# Patient Record
Sex: Male | Born: 1935 | Race: White | Hispanic: No | Marital: Married | Smoking: Former smoker
Health system: Southern US, Community
[De-identification: ages and names within clinical notes are randomized; demographics above are authoritative.]

## PROBLEM LIST (undated history)

## (undated) DIAGNOSIS — I2510 Atherosclerotic heart disease of native coronary artery without angina pectoris: Secondary | ICD-10-CM

## (undated) DIAGNOSIS — N189 Chronic kidney disease, unspecified: Secondary | ICD-10-CM

## (undated) DIAGNOSIS — J449 Chronic obstructive pulmonary disease, unspecified: Secondary | ICD-10-CM

## (undated) DIAGNOSIS — E785 Hyperlipidemia, unspecified: Secondary | ICD-10-CM

## (undated) DIAGNOSIS — H409 Unspecified glaucoma: Secondary | ICD-10-CM

## (undated) DIAGNOSIS — R0602 Shortness of breath: Secondary | ICD-10-CM

## (undated) DIAGNOSIS — I10 Essential (primary) hypertension: Secondary | ICD-10-CM

## (undated) HISTORY — DX: Hyperlipidemia, unspecified: E78.5

## (undated) HISTORY — DX: Chronic obstructive pulmonary disease, unspecified: J44.9

## (undated) HISTORY — PX: CORONARY ARTERY BYPASS GRAFT: SHX141

## (undated) HISTORY — DX: Atherosclerotic heart disease of native coronary artery without angina pectoris: I25.10

## (undated) HISTORY — DX: Chronic kidney disease, unspecified: N18.9

## (undated) HISTORY — DX: Shortness of breath: R06.02

## (undated) HISTORY — DX: Unspecified glaucoma: H40.9

## (undated) HISTORY — DX: Essential (primary) hypertension: I10

---

## 2006-11-15 ENCOUNTER — Ambulatory Visit: Payer: Self-pay | Admitting: Cardiothoracic Surgery

## 2006-12-27 ENCOUNTER — Ambulatory Visit: Payer: Self-pay | Admitting: Thoracic Surgery (Cardiothoracic Vascular Surgery)

## 2014-04-25 DIAGNOSIS — M353 Polymyalgia rheumatica: Secondary | ICD-10-CM | POA: Diagnosis not present

## 2014-04-25 DIAGNOSIS — Z79899 Other long term (current) drug therapy: Secondary | ICD-10-CM | POA: Diagnosis not present

## 2014-04-29 DIAGNOSIS — I2510 Atherosclerotic heart disease of native coronary artery without angina pectoris: Secondary | ICD-10-CM | POA: Diagnosis not present

## 2014-05-29 DIAGNOSIS — I2510 Atherosclerotic heart disease of native coronary artery without angina pectoris: Secondary | ICD-10-CM | POA: Diagnosis not present

## 2014-05-29 DIAGNOSIS — E119 Type 2 diabetes mellitus without complications: Secondary | ICD-10-CM | POA: Diagnosis not present

## 2014-05-29 DIAGNOSIS — I10 Essential (primary) hypertension: Secondary | ICD-10-CM | POA: Diagnosis not present

## 2014-05-29 DIAGNOSIS — E785 Hyperlipidemia, unspecified: Secondary | ICD-10-CM | POA: Diagnosis not present

## 2014-06-19 DIAGNOSIS — H4011X3 Primary open-angle glaucoma, severe stage: Secondary | ICD-10-CM | POA: Diagnosis not present

## 2014-06-19 DIAGNOSIS — H2513 Age-related nuclear cataract, bilateral: Secondary | ICD-10-CM | POA: Diagnosis not present

## 2014-07-21 DIAGNOSIS — H4011X3 Primary open-angle glaucoma, severe stage: Secondary | ICD-10-CM | POA: Diagnosis not present

## 2014-07-25 DIAGNOSIS — M353 Polymyalgia rheumatica: Secondary | ICD-10-CM | POA: Diagnosis not present

## 2014-07-25 DIAGNOSIS — Z79899 Other long term (current) drug therapy: Secondary | ICD-10-CM | POA: Diagnosis not present

## 2014-09-01 DIAGNOSIS — E559 Vitamin D deficiency, unspecified: Secondary | ICD-10-CM | POA: Diagnosis not present

## 2014-09-01 DIAGNOSIS — E785 Hyperlipidemia, unspecified: Secondary | ICD-10-CM | POA: Diagnosis not present

## 2014-09-01 DIAGNOSIS — J449 Chronic obstructive pulmonary disease, unspecified: Secondary | ICD-10-CM | POA: Diagnosis not present

## 2014-09-01 DIAGNOSIS — I10 Essential (primary) hypertension: Secondary | ICD-10-CM | POA: Diagnosis not present

## 2014-09-01 DIAGNOSIS — N182 Chronic kidney disease, stage 2 (mild): Secondary | ICD-10-CM | POA: Diagnosis not present

## 2014-09-01 DIAGNOSIS — I119 Hypertensive heart disease without heart failure: Secondary | ICD-10-CM | POA: Diagnosis not present

## 2014-09-01 DIAGNOSIS — Z79899 Other long term (current) drug therapy: Secondary | ICD-10-CM | POA: Diagnosis not present

## 2014-09-01 DIAGNOSIS — E1169 Type 2 diabetes mellitus with other specified complication: Secondary | ICD-10-CM | POA: Diagnosis not present

## 2014-09-01 DIAGNOSIS — I1310 Hypertensive heart and chronic kidney disease without heart failure, with stage 1 through stage 4 chronic kidney disease, or unspecified chronic kidney disease: Secondary | ICD-10-CM | POA: Diagnosis not present

## 2014-10-01 DIAGNOSIS — N182 Chronic kidney disease, stage 2 (mild): Secondary | ICD-10-CM | POA: Diagnosis not present

## 2014-10-01 DIAGNOSIS — I1310 Hypertensive heart and chronic kidney disease without heart failure, with stage 1 through stage 4 chronic kidney disease, or unspecified chronic kidney disease: Secondary | ICD-10-CM | POA: Diagnosis not present

## 2014-10-01 DIAGNOSIS — I119 Hypertensive heart disease without heart failure: Secondary | ICD-10-CM | POA: Diagnosis not present

## 2014-10-07 DIAGNOSIS — E785 Hyperlipidemia, unspecified: Secondary | ICD-10-CM

## 2014-10-07 DIAGNOSIS — I10 Essential (primary) hypertension: Secondary | ICD-10-CM | POA: Insufficient documentation

## 2014-10-07 DIAGNOSIS — I2510 Atherosclerotic heart disease of native coronary artery without angina pectoris: Secondary | ICD-10-CM | POA: Insufficient documentation

## 2014-10-07 DIAGNOSIS — E118 Type 2 diabetes mellitus with unspecified complications: Secondary | ICD-10-CM | POA: Diagnosis not present

## 2014-10-07 HISTORY — DX: Essential (primary) hypertension: I10

## 2014-10-07 HISTORY — DX: Atherosclerotic heart disease of native coronary artery without angina pectoris: I25.10

## 2014-10-07 HISTORY — DX: Hyperlipidemia, unspecified: E78.5

## 2014-10-24 DIAGNOSIS — M353 Polymyalgia rheumatica: Secondary | ICD-10-CM | POA: Diagnosis not present

## 2014-10-24 DIAGNOSIS — M2550 Pain in unspecified joint: Secondary | ICD-10-CM | POA: Diagnosis not present

## 2014-10-24 DIAGNOSIS — Z79899 Other long term (current) drug therapy: Secondary | ICD-10-CM | POA: Diagnosis not present

## 2014-11-17 DIAGNOSIS — H4011X3 Primary open-angle glaucoma, severe stage: Secondary | ICD-10-CM | POA: Diagnosis not present

## 2014-12-03 DIAGNOSIS — I1310 Hypertensive heart and chronic kidney disease without heart failure, with stage 1 through stage 4 chronic kidney disease, or unspecified chronic kidney disease: Secondary | ICD-10-CM | POA: Diagnosis not present

## 2014-12-03 DIAGNOSIS — N182 Chronic kidney disease, stage 2 (mild): Secondary | ICD-10-CM | POA: Diagnosis not present

## 2014-12-17 DIAGNOSIS — Z23 Encounter for immunization: Secondary | ICD-10-CM | POA: Diagnosis not present

## 2015-02-06 DIAGNOSIS — Z23 Encounter for immunization: Secondary | ICD-10-CM | POA: Diagnosis not present

## 2015-02-06 DIAGNOSIS — H04129 Dry eye syndrome of unspecified lacrimal gland: Secondary | ICD-10-CM | POA: Diagnosis not present

## 2015-02-06 DIAGNOSIS — M2550 Pain in unspecified joint: Secondary | ICD-10-CM | POA: Diagnosis not present

## 2015-02-06 DIAGNOSIS — M353 Polymyalgia rheumatica: Secondary | ICD-10-CM | POA: Diagnosis not present

## 2015-02-11 DIAGNOSIS — J0100 Acute maxillary sinusitis, unspecified: Secondary | ICD-10-CM | POA: Diagnosis not present

## 2015-02-11 DIAGNOSIS — J209 Acute bronchitis, unspecified: Secondary | ICD-10-CM | POA: Diagnosis not present

## 2015-02-16 DIAGNOSIS — H401193 Primary open-angle glaucoma, unspecified eye, severe stage: Secondary | ICD-10-CM | POA: Diagnosis not present

## 2015-02-23 DIAGNOSIS — M353 Polymyalgia rheumatica: Secondary | ICD-10-CM | POA: Diagnosis not present

## 2015-02-23 DIAGNOSIS — E785 Hyperlipidemia, unspecified: Secondary | ICD-10-CM | POA: Diagnosis not present

## 2015-02-23 DIAGNOSIS — N182 Chronic kidney disease, stage 2 (mild): Secondary | ICD-10-CM | POA: Diagnosis not present

## 2015-02-23 DIAGNOSIS — I119 Hypertensive heart disease without heart failure: Secondary | ICD-10-CM | POA: Diagnosis not present

## 2015-02-23 DIAGNOSIS — E1169 Type 2 diabetes mellitus with other specified complication: Secondary | ICD-10-CM | POA: Diagnosis not present

## 2015-02-23 DIAGNOSIS — Z79899 Other long term (current) drug therapy: Secondary | ICD-10-CM | POA: Diagnosis not present

## 2015-02-23 DIAGNOSIS — Z136 Encounter for screening for cardiovascular disorders: Secondary | ICD-10-CM | POA: Diagnosis not present

## 2015-02-23 DIAGNOSIS — Z1211 Encounter for screening for malignant neoplasm of colon: Secondary | ICD-10-CM | POA: Diagnosis not present

## 2015-02-23 DIAGNOSIS — I10 Essential (primary) hypertension: Secondary | ICD-10-CM | POA: Diagnosis not present

## 2015-02-23 DIAGNOSIS — Z125 Encounter for screening for malignant neoplasm of prostate: Secondary | ICD-10-CM | POA: Diagnosis not present

## 2015-02-23 DIAGNOSIS — I1310 Hypertensive heart and chronic kidney disease without heart failure, with stage 1 through stage 4 chronic kidney disease, or unspecified chronic kidney disease: Secondary | ICD-10-CM | POA: Diagnosis not present

## 2015-02-23 DIAGNOSIS — Z0000 Encounter for general adult medical examination without abnormal findings: Secondary | ICD-10-CM | POA: Diagnosis not present

## 2015-03-09 DIAGNOSIS — H401193 Primary open-angle glaucoma, unspecified eye, severe stage: Secondary | ICD-10-CM | POA: Diagnosis not present

## 2015-03-30 DIAGNOSIS — H401193 Primary open-angle glaucoma, unspecified eye, severe stage: Secondary | ICD-10-CM | POA: Diagnosis not present

## 2015-04-13 DIAGNOSIS — E0811 Diabetes mellitus due to underlying condition with ketoacidosis with coma: Secondary | ICD-10-CM | POA: Diagnosis not present

## 2015-04-13 DIAGNOSIS — I10 Essential (primary) hypertension: Secondary | ICD-10-CM | POA: Diagnosis not present

## 2015-04-13 DIAGNOSIS — I2510 Atherosclerotic heart disease of native coronary artery without angina pectoris: Secondary | ICD-10-CM | POA: Diagnosis not present

## 2015-05-04 DIAGNOSIS — H401193 Primary open-angle glaucoma, unspecified eye, severe stage: Secondary | ICD-10-CM | POA: Diagnosis not present

## 2015-05-09 DIAGNOSIS — H04129 Dry eye syndrome of unspecified lacrimal gland: Secondary | ICD-10-CM

## 2015-05-09 DIAGNOSIS — E559 Vitamin D deficiency, unspecified: Secondary | ICD-10-CM

## 2015-05-09 DIAGNOSIS — J90 Pleural effusion, not elsewhere classified: Secondary | ICD-10-CM | POA: Insufficient documentation

## 2015-05-09 DIAGNOSIS — H349 Unspecified retinal vascular occlusion: Secondary | ICD-10-CM

## 2015-05-09 DIAGNOSIS — H409 Unspecified glaucoma: Secondary | ICD-10-CM

## 2015-05-09 DIAGNOSIS — J309 Allergic rhinitis, unspecified: Secondary | ICD-10-CM

## 2015-05-09 DIAGNOSIS — M2550 Pain in unspecified joint: Secondary | ICD-10-CM | POA: Insufficient documentation

## 2015-05-09 DIAGNOSIS — Z79899 Other long term (current) drug therapy: Secondary | ICD-10-CM

## 2015-05-09 DIAGNOSIS — G894 Chronic pain syndrome: Secondary | ICD-10-CM

## 2015-05-09 DIAGNOSIS — J449 Chronic obstructive pulmonary disease, unspecified: Secondary | ICD-10-CM | POA: Insufficient documentation

## 2015-05-09 DIAGNOSIS — E119 Type 2 diabetes mellitus without complications: Secondary | ICD-10-CM | POA: Insufficient documentation

## 2015-05-09 HISTORY — DX: Dry eye syndrome of unspecified lacrimal gland: H04.129

## 2015-05-09 HISTORY — DX: Pain in unspecified joint: M25.50

## 2015-05-09 HISTORY — DX: Unspecified glaucoma: H40.9

## 2015-05-09 HISTORY — DX: Chronic pain syndrome: G89.4

## 2015-05-09 HISTORY — DX: Vitamin D deficiency, unspecified: E55.9

## 2015-05-09 HISTORY — DX: Unspecified retinal vascular occlusion: H34.9

## 2015-05-09 HISTORY — DX: Other long term (current) drug therapy: Z79.899

## 2015-05-09 HISTORY — DX: Allergic rhinitis, unspecified: J30.9

## 2015-05-09 HISTORY — DX: Pleural effusion, not elsewhere classified: J90

## 2015-05-11 DIAGNOSIS — Z79899 Other long term (current) drug therapy: Secondary | ICD-10-CM | POA: Diagnosis not present

## 2015-05-11 DIAGNOSIS — M2550 Pain in unspecified joint: Secondary | ICD-10-CM | POA: Diagnosis not present

## 2015-05-11 DIAGNOSIS — M353 Polymyalgia rheumatica: Secondary | ICD-10-CM | POA: Diagnosis not present

## 2015-06-08 DIAGNOSIS — H2513 Age-related nuclear cataract, bilateral: Secondary | ICD-10-CM | POA: Diagnosis not present

## 2015-06-08 DIAGNOSIS — H0289 Other specified disorders of eyelid: Secondary | ICD-10-CM | POA: Diagnosis not present

## 2015-06-08 DIAGNOSIS — H25013 Cortical age-related cataract, bilateral: Secondary | ICD-10-CM | POA: Diagnosis not present

## 2015-06-08 DIAGNOSIS — H401130 Primary open-angle glaucoma, bilateral, stage unspecified: Secondary | ICD-10-CM | POA: Diagnosis not present

## 2015-06-15 DIAGNOSIS — H401110 Primary open-angle glaucoma, right eye, stage unspecified: Secondary | ICD-10-CM | POA: Diagnosis not present

## 2015-06-29 DIAGNOSIS — H401132 Primary open-angle glaucoma, bilateral, moderate stage: Secondary | ICD-10-CM | POA: Diagnosis not present

## 2015-07-13 DIAGNOSIS — H401120 Primary open-angle glaucoma, left eye, stage unspecified: Secondary | ICD-10-CM | POA: Diagnosis not present

## 2015-07-27 DIAGNOSIS — H401193 Primary open-angle glaucoma, unspecified eye, severe stage: Secondary | ICD-10-CM | POA: Diagnosis not present

## 2015-08-17 DIAGNOSIS — Z79899 Other long term (current) drug therapy: Secondary | ICD-10-CM | POA: Diagnosis not present

## 2015-08-17 DIAGNOSIS — M353 Polymyalgia rheumatica: Secondary | ICD-10-CM | POA: Diagnosis not present

## 2015-08-17 DIAGNOSIS — M2550 Pain in unspecified joint: Secondary | ICD-10-CM | POA: Diagnosis not present

## 2015-08-17 DIAGNOSIS — H401132 Primary open-angle glaucoma, bilateral, moderate stage: Secondary | ICD-10-CM | POA: Diagnosis not present

## 2015-09-01 DIAGNOSIS — E1169 Type 2 diabetes mellitus with other specified complication: Secondary | ICD-10-CM | POA: Diagnosis not present

## 2015-09-01 DIAGNOSIS — M353 Polymyalgia rheumatica: Secondary | ICD-10-CM | POA: Diagnosis not present

## 2015-09-01 DIAGNOSIS — Z79899 Other long term (current) drug therapy: Secondary | ICD-10-CM | POA: Diagnosis not present

## 2015-09-01 DIAGNOSIS — E785 Hyperlipidemia, unspecified: Secondary | ICD-10-CM | POA: Diagnosis not present

## 2015-09-01 DIAGNOSIS — Z87898 Personal history of other specified conditions: Secondary | ICD-10-CM | POA: Diagnosis not present

## 2015-09-01 DIAGNOSIS — I119 Hypertensive heart disease without heart failure: Secondary | ICD-10-CM | POA: Diagnosis not present

## 2015-09-01 DIAGNOSIS — I2510 Atherosclerotic heart disease of native coronary artery without angina pectoris: Secondary | ICD-10-CM | POA: Diagnosis not present

## 2015-10-09 DIAGNOSIS — I10 Essential (primary) hypertension: Secondary | ICD-10-CM | POA: Diagnosis not present

## 2015-10-09 DIAGNOSIS — E0800 Diabetes mellitus due to underlying condition with hyperosmolarity without nonketotic hyperglycemic-hyperosmolar coma (NKHHC): Secondary | ICD-10-CM | POA: Diagnosis not present

## 2015-10-09 DIAGNOSIS — I2510 Atherosclerotic heart disease of native coronary artery without angina pectoris: Secondary | ICD-10-CM | POA: Diagnosis not present

## 2015-10-09 DIAGNOSIS — E785 Hyperlipidemia, unspecified: Secondary | ICD-10-CM | POA: Diagnosis not present

## 2015-11-23 DIAGNOSIS — M2550 Pain in unspecified joint: Secondary | ICD-10-CM | POA: Diagnosis not present

## 2015-11-23 DIAGNOSIS — M353 Polymyalgia rheumatica: Secondary | ICD-10-CM | POA: Diagnosis not present

## 2015-11-23 DIAGNOSIS — Z79899 Other long term (current) drug therapy: Secondary | ICD-10-CM | POA: Diagnosis not present

## 2015-12-07 DIAGNOSIS — Z23 Encounter for immunization: Secondary | ICD-10-CM | POA: Diagnosis not present

## 2015-12-17 DIAGNOSIS — H401193 Primary open-angle glaucoma, unspecified eye, severe stage: Secondary | ICD-10-CM | POA: Diagnosis not present

## 2016-01-18 DIAGNOSIS — H401193 Primary open-angle glaucoma, unspecified eye, severe stage: Secondary | ICD-10-CM | POA: Diagnosis not present

## 2016-01-18 DIAGNOSIS — H2513 Age-related nuclear cataract, bilateral: Secondary | ICD-10-CM | POA: Diagnosis not present

## 2016-03-10 DIAGNOSIS — E1169 Type 2 diabetes mellitus with other specified complication: Secondary | ICD-10-CM | POA: Diagnosis not present

## 2016-03-10 DIAGNOSIS — I10 Essential (primary) hypertension: Secondary | ICD-10-CM | POA: Diagnosis not present

## 2016-03-10 DIAGNOSIS — N183 Chronic kidney disease, stage 3 (moderate): Secondary | ICD-10-CM | POA: Diagnosis not present

## 2016-03-10 DIAGNOSIS — D751 Secondary polycythemia: Secondary | ICD-10-CM | POA: Diagnosis not present

## 2016-03-10 DIAGNOSIS — E785 Hyperlipidemia, unspecified: Secondary | ICD-10-CM | POA: Diagnosis not present

## 2016-03-10 DIAGNOSIS — E559 Vitamin D deficiency, unspecified: Secondary | ICD-10-CM | POA: Diagnosis not present

## 2016-03-10 DIAGNOSIS — Z0000 Encounter for general adult medical examination without abnormal findings: Secondary | ICD-10-CM | POA: Diagnosis not present

## 2016-03-10 DIAGNOSIS — R5383 Other fatigue: Secondary | ICD-10-CM | POA: Diagnosis not present

## 2016-03-10 DIAGNOSIS — Z125 Encounter for screening for malignant neoplasm of prostate: Secondary | ICD-10-CM | POA: Diagnosis not present

## 2016-03-10 DIAGNOSIS — Z79899 Other long term (current) drug therapy: Secondary | ICD-10-CM | POA: Diagnosis not present

## 2016-03-30 DIAGNOSIS — M353 Polymyalgia rheumatica: Secondary | ICD-10-CM | POA: Diagnosis not present

## 2016-03-30 DIAGNOSIS — Z79899 Other long term (current) drug therapy: Secondary | ICD-10-CM | POA: Diagnosis not present

## 2016-03-30 DIAGNOSIS — I1310 Hypertensive heart and chronic kidney disease without heart failure, with stage 1 through stage 4 chronic kidney disease, or unspecified chronic kidney disease: Secondary | ICD-10-CM | POA: Diagnosis not present

## 2016-03-30 DIAGNOSIS — J449 Chronic obstructive pulmonary disease, unspecified: Secondary | ICD-10-CM | POA: Diagnosis not present

## 2016-03-30 DIAGNOSIS — N182 Chronic kidney disease, stage 2 (mild): Secondary | ICD-10-CM | POA: Diagnosis not present

## 2016-03-30 DIAGNOSIS — E1169 Type 2 diabetes mellitus with other specified complication: Secondary | ICD-10-CM | POA: Diagnosis not present

## 2016-03-30 DIAGNOSIS — E785 Hyperlipidemia, unspecified: Secondary | ICD-10-CM | POA: Diagnosis not present

## 2016-04-13 DIAGNOSIS — J449 Chronic obstructive pulmonary disease, unspecified: Secondary | ICD-10-CM | POA: Diagnosis not present

## 2016-04-14 DIAGNOSIS — E0800 Diabetes mellitus due to underlying condition with hyperosmolarity without nonketotic hyperglycemic-hyperosmolar coma (NKHHC): Secondary | ICD-10-CM | POA: Diagnosis not present

## 2016-04-14 DIAGNOSIS — I2510 Atherosclerotic heart disease of native coronary artery without angina pectoris: Secondary | ICD-10-CM | POA: Diagnosis not present

## 2016-04-14 DIAGNOSIS — I10 Essential (primary) hypertension: Secondary | ICD-10-CM | POA: Diagnosis not present

## 2016-04-20 DIAGNOSIS — I2510 Atherosclerotic heart disease of native coronary artery without angina pectoris: Secondary | ICD-10-CM | POA: Diagnosis not present

## 2016-04-20 DIAGNOSIS — I10 Essential (primary) hypertension: Secondary | ICD-10-CM | POA: Diagnosis not present

## 2016-04-20 DIAGNOSIS — E0800 Diabetes mellitus due to underlying condition with hyperosmolarity without nonketotic hyperglycemic-hyperosmolar coma (NKHHC): Secondary | ICD-10-CM | POA: Diagnosis not present

## 2016-05-16 DIAGNOSIS — H401193 Primary open-angle glaucoma, unspecified eye, severe stage: Secondary | ICD-10-CM | POA: Diagnosis not present

## 2016-05-17 DIAGNOSIS — E785 Hyperlipidemia, unspecified: Secondary | ICD-10-CM | POA: Diagnosis not present

## 2016-05-17 DIAGNOSIS — I1310 Hypertensive heart and chronic kidney disease without heart failure, with stage 1 through stage 4 chronic kidney disease, or unspecified chronic kidney disease: Secondary | ICD-10-CM | POA: Diagnosis not present

## 2016-05-17 DIAGNOSIS — N189 Chronic kidney disease, unspecified: Secondary | ICD-10-CM | POA: Diagnosis not present

## 2016-05-17 DIAGNOSIS — E559 Vitamin D deficiency, unspecified: Secondary | ICD-10-CM | POA: Diagnosis not present

## 2016-05-17 DIAGNOSIS — E211 Secondary hyperparathyroidism, not elsewhere classified: Secondary | ICD-10-CM | POA: Diagnosis not present

## 2016-05-17 DIAGNOSIS — D649 Anemia, unspecified: Secondary | ICD-10-CM | POA: Diagnosis not present

## 2016-05-17 DIAGNOSIS — I10 Essential (primary) hypertension: Secondary | ICD-10-CM | POA: Diagnosis not present

## 2016-05-17 DIAGNOSIS — M353 Polymyalgia rheumatica: Secondary | ICD-10-CM | POA: Diagnosis not present

## 2016-05-17 DIAGNOSIS — R809 Proteinuria, unspecified: Secondary | ICD-10-CM | POA: Diagnosis not present

## 2016-05-23 DIAGNOSIS — M353 Polymyalgia rheumatica: Secondary | ICD-10-CM | POA: Diagnosis not present

## 2016-05-23 DIAGNOSIS — M2550 Pain in unspecified joint: Secondary | ICD-10-CM | POA: Diagnosis not present

## 2016-05-23 DIAGNOSIS — Z79899 Other long term (current) drug therapy: Secondary | ICD-10-CM | POA: Diagnosis not present

## 2016-08-23 DIAGNOSIS — M2550 Pain in unspecified joint: Secondary | ICD-10-CM | POA: Diagnosis not present

## 2016-08-23 DIAGNOSIS — M353 Polymyalgia rheumatica: Secondary | ICD-10-CM | POA: Diagnosis not present

## 2016-08-23 DIAGNOSIS — Z79899 Other long term (current) drug therapy: Secondary | ICD-10-CM | POA: Diagnosis not present

## 2016-09-13 DIAGNOSIS — H401193 Primary open-angle glaucoma, unspecified eye, severe stage: Secondary | ICD-10-CM | POA: Diagnosis not present

## 2016-09-13 DIAGNOSIS — H2513 Age-related nuclear cataract, bilateral: Secondary | ICD-10-CM | POA: Diagnosis not present

## 2016-09-19 DIAGNOSIS — N189 Chronic kidney disease, unspecified: Secondary | ICD-10-CM | POA: Diagnosis not present

## 2016-09-19 DIAGNOSIS — E785 Hyperlipidemia, unspecified: Secondary | ICD-10-CM | POA: Diagnosis not present

## 2016-09-19 DIAGNOSIS — M353 Polymyalgia rheumatica: Secondary | ICD-10-CM | POA: Diagnosis not present

## 2016-09-19 DIAGNOSIS — R809 Proteinuria, unspecified: Secondary | ICD-10-CM | POA: Diagnosis not present

## 2016-09-19 DIAGNOSIS — I10 Essential (primary) hypertension: Secondary | ICD-10-CM | POA: Diagnosis not present

## 2016-09-19 DIAGNOSIS — D649 Anemia, unspecified: Secondary | ICD-10-CM | POA: Diagnosis not present

## 2016-10-11 DIAGNOSIS — M353 Polymyalgia rheumatica: Secondary | ICD-10-CM | POA: Diagnosis not present

## 2016-10-11 DIAGNOSIS — I1310 Hypertensive heart and chronic kidney disease without heart failure, with stage 1 through stage 4 chronic kidney disease, or unspecified chronic kidney disease: Secondary | ICD-10-CM | POA: Diagnosis not present

## 2016-10-11 DIAGNOSIS — N182 Chronic kidney disease, stage 2 (mild): Secondary | ICD-10-CM | POA: Diagnosis not present

## 2016-10-11 DIAGNOSIS — J449 Chronic obstructive pulmonary disease, unspecified: Secondary | ICD-10-CM | POA: Diagnosis not present

## 2016-10-11 DIAGNOSIS — E785 Hyperlipidemia, unspecified: Secondary | ICD-10-CM | POA: Diagnosis not present

## 2016-10-11 DIAGNOSIS — D72829 Elevated white blood cell count, unspecified: Secondary | ICD-10-CM | POA: Diagnosis not present

## 2016-10-11 DIAGNOSIS — Z79899 Other long term (current) drug therapy: Secondary | ICD-10-CM | POA: Diagnosis not present

## 2016-10-11 DIAGNOSIS — E1122 Type 2 diabetes mellitus with diabetic chronic kidney disease: Secondary | ICD-10-CM | POA: Diagnosis not present

## 2016-10-17 ENCOUNTER — Ambulatory Visit (INDEPENDENT_AMBULATORY_CARE_PROVIDER_SITE_OTHER): Payer: Medicare Other | Admitting: Cardiology

## 2016-10-17 ENCOUNTER — Encounter: Payer: Self-pay | Admitting: Cardiology

## 2016-10-17 VITALS — BP 122/74 | HR 78 | Ht 67.0 in | Wt 170.8 lb

## 2016-10-17 DIAGNOSIS — E1100 Type 2 diabetes mellitus with hyperosmolarity without nonketotic hyperglycemic-hyperosmolar coma (NKHHC): Secondary | ICD-10-CM

## 2016-10-17 DIAGNOSIS — I2510 Atherosclerotic heart disease of native coronary artery without angina pectoris: Secondary | ICD-10-CM | POA: Diagnosis not present

## 2016-10-17 DIAGNOSIS — I10 Essential (primary) hypertension: Secondary | ICD-10-CM | POA: Diagnosis not present

## 2016-10-17 DIAGNOSIS — E785 Hyperlipidemia, unspecified: Secondary | ICD-10-CM

## 2016-10-17 NOTE — Patient Instructions (Signed)
Medication Instructions:  Your physician recommends that you continue on your current medications as directed. Please refer to the Current Medication list given to you today.  Labwork: None    Testing/Procedures: None   Follow-Up: Your physician wants you to follow-up in: 6 months. You will receive a reminder letter in the mail two months in advance. If you don't receive a letter, please call our office to schedule the follow-up appointment.  Any Other Special Instructions Will Be Listed Below (If Applicable).  Please note that any paperwork needing to be filled out by the provider will need to be addressed at the front desk prior to seeing the provider. Please note that any paperwork FMLA, Disability or other documents regarding health condition is subject to a $25.00 charge that must be received prior to completion of paperwork.    If you need a refill on your cardiac medications before your next appointment, please call your pharmacy.  

## 2016-10-17 NOTE — Progress Notes (Signed)
Cardiology Office Note:    Date:  10/17/2016   ID:  Christopher Kline, DOB 05-Jan-1931, MRN [id number]  PCP:  Doreene Eland, MD  Cardiologist:  Garwin Brothers, MD   Referring MD: Doreene Eland, MD    ASSESSMENT:    1. Coronary artery disease involving native coronary artery of native heart without angina pectoris   2. Essential hypertension   3. Type 2 diabetes mellitus with hyperosmolarity without coma, without long-term current use of insulin (HCC)   4. Dyslipidemia    PLAN:    In order of problems listed above:  1. Secondary prevention stressed to the patient. Importance of compliance with diet and medications stressed and he vocalized understanding. His blood pressure stable. Diet was discussed with dyslipidemia and diabetes mellitus. Importance of regular exercise stressed. He says that he prefers to use a stationary bicycle and I told him that that would be fine. He will be seen in follow-up appointment in 6 months or earlier if he has any concerns. He does have sublingual nitroglycerin and knows to use it when necessary.   Medication Adjustments/Labs and Tests Ordered: Current medicines are reviewed at length with the patient today.  Concerns regarding medicines are outlined above.  No orders of the defined types were placed in this encounter.  No orders of the defined types were placed in this encounter.    History of Present Illness:    Christopher Kline is a 83 y.o. male who is being seen today for the evaluation of Coronary artery disease at the request of Doreene Eland, MD. Patient is a pleasant 83 year old male. He has past medical history of coronary artery disease, essential hypertension, diabetes mellitus and dyslipidemia. He was evaluated I me in my previous practice and wants to be transferred here to continue in with the sedentary lifestyle. His wife accompanies him. Denies any chest pain orthopnea or PND. He is not had any issues since last  evaluation.  Past Medical History:  Diagnosis Date  . CAD (coronary artery disease)   . Chronic kidney disease   . COPD (chronic obstructive pulmonary disease) (HCC)   . Glaucoma   . Hyperlipidemia   . Hypertension   . SOB (shortness of breath)     Past Surgical History:  Procedure Laterality Date  . CORONARY ARTERY BYPASS GRAFT      Current Medications: Current Meds  Medication Sig  . aspirin EC 81 MG tablet Take 81 mg by mouth daily.  Marland Kitchen atorvastatin (LIPITOR) 80 MG tablet Take 40 mg by mouth daily.  . carvedilol (COREG) 6.25 MG tablet Take 6.25 mg by mouth daily.  . dorzolamide (TRUSOPT) 2 % ophthalmic solution Place 1 drop into both eyes 2 (two) times daily.  . Ergocalciferol (VITAMIN D2) 2000 units TABS Take 1 tablet by mouth daily.  . folic acid (FOLVITE) 1 MG tablet Take 1 mg by mouth daily.  Marland Kitchen latanoprost (XALATAN) 0.005 % ophthalmic solution Place 1 drop into both eyes daily.  Marland Kitchen lisinopril (PRINIVIL,ZESTRIL) 20 MG tablet Take 20 mg by mouth 2 (two) times daily.  . methotrexate (RHEUMATREX) 2.5 MG tablet Take 4 tablets by mouth once a week.  . Multiple Vitamin (MULTIVITAMIN) tablet Take 1 tablet by mouth daily.  . Omega-3 Fatty Acids (OMEGA-3 FISH OIL) 1200 MG CAPS Take 1 capsule by mouth daily.  . Potassium 99 MG TABS Take 1 tablet by mouth daily.  . predniSONE (DELTASONE) 1 MG tablet Take 3 tablets by mouth daily.  Marland Kitchen  Tiotropium Bromide-Olodaterol (STIOLTO RESPIMAT) 2.5-2.5 MCG/ACT AERS Inhale 2 puffs into the lungs daily.     Allergies:   Vancomycin   Social History   Social History  . Marital status: Married    Spouse name: N/A  . Number of children: N/A  . Years of education: N/A   Social History Main Topics  . Smoking status: Former Games developermoker  . Smokeless tobacco: Never Used  . Alcohol use No  . Drug use: No  . Sexual activity: Not Asked   Other Topics Concern  . None   Social History Narrative  . None     Family History: The patient's family  history is not on file.  ROS:   Please see the history of present illness.    All other systems reviewed and are negative.  EKGs/Labs/Other Studies Reviewed:    The following studies were reviewed today: I reviewed the office records and answered his and his wife's questions to their satisfaction. They brought the handwritten note about his workup and his total cholesterol was 157, HDL 41, LDL 83 and triglycerides 161.   Recent Labs: No results found for requested labs within last 8760 hours.  Recent Lipid Panel No results found for: CHOL, TRIG, HDL, CHOLHDL, VLDL, LDLCALC, LDLDIRECT  Physical Exam:    VS:  BP 122/74   Pulse 78   Ht 5\' 7"  (1.702 m)   Wt 170 lb 12.8 oz (77.5 kg)   SpO2 98%   BMI 26.75 kg/m     Wt Readings from Last 3 Encounters:  10/17/16 170 lb 12.8 oz (77.5 kg)     GEN: Patient is in no acute distress HEENT: Normal NECK: No JVD; No carotid bruits LYMPHATICS: No lymphadenopathy CARDIAC: S1 S2 regular, 2/6 systolic murmur at the apex. RESPIRATORY:  Clear to auscultation without rales, wheezing or rhonchi  ABDOMEN: Soft, non-tender, non-distended MUSCULOSKELETAL:  No edema; No deformity  SKIN: Warm and dry NEUROLOGIC:  Alert and oriented x 3 PSYCHIATRIC:  Normal affect    Signed, Garwin Brothersajan R Revankar, MD  10/17/2016 2:45 PM    [facility]

## 2016-12-13 DIAGNOSIS — M353 Polymyalgia rheumatica: Secondary | ICD-10-CM | POA: Diagnosis not present

## 2016-12-13 DIAGNOSIS — M2550 Pain in unspecified joint: Secondary | ICD-10-CM | POA: Diagnosis not present

## 2016-12-13 DIAGNOSIS — Z79899 Other long term (current) drug therapy: Secondary | ICD-10-CM | POA: Diagnosis not present

## 2017-01-17 DIAGNOSIS — H2513 Age-related nuclear cataract, bilateral: Secondary | ICD-10-CM | POA: Diagnosis not present

## 2017-01-17 DIAGNOSIS — H47099 Other disorders of optic nerve, not elsewhere classified, unspecified eye: Secondary | ICD-10-CM | POA: Diagnosis not present

## 2017-01-17 DIAGNOSIS — H401193 Primary open-angle glaucoma, unspecified eye, severe stage: Secondary | ICD-10-CM | POA: Diagnosis not present

## 2017-03-09 ENCOUNTER — Other Ambulatory Visit: Payer: Self-pay

## 2017-03-09 MED ORDER — LISINOPRIL 20 MG PO TABS: 20.0000 mg | ORAL_TABLET | Freq: Two times a day (BID) | ORAL | 1 refills | Status: DC

## 2017-03-13 ENCOUNTER — Other Ambulatory Visit: Payer: Self-pay

## 2017-03-13 MED ORDER — LISINOPRIL 20 MG PO TABS: 20.0000 mg | ORAL_TABLET | Freq: Two times a day (BID) | ORAL | 1 refills | Status: DC

## 2017-03-16 DIAGNOSIS — Z136 Encounter for screening for cardiovascular disorders: Secondary | ICD-10-CM | POA: Diagnosis not present

## 2017-03-16 DIAGNOSIS — Z125 Encounter for screening for malignant neoplasm of prostate: Secondary | ICD-10-CM | POA: Diagnosis not present

## 2017-03-16 DIAGNOSIS — E785 Hyperlipidemia, unspecified: Secondary | ICD-10-CM | POA: Diagnosis not present

## 2017-03-16 DIAGNOSIS — Z0000 Encounter for general adult medical examination without abnormal findings: Secondary | ICD-10-CM | POA: Diagnosis not present

## 2017-03-17 ENCOUNTER — Other Ambulatory Visit: Payer: Self-pay

## 2017-03-17 DIAGNOSIS — Z79899 Other long term (current) drug therapy: Secondary | ICD-10-CM | POA: Diagnosis not present

## 2017-03-17 DIAGNOSIS — M2550 Pain in unspecified joint: Secondary | ICD-10-CM | POA: Diagnosis not present

## 2017-03-17 DIAGNOSIS — M353 Polymyalgia rheumatica: Secondary | ICD-10-CM | POA: Diagnosis not present

## 2017-03-21 DIAGNOSIS — N189 Chronic kidney disease, unspecified: Secondary | ICD-10-CM | POA: Diagnosis not present

## 2017-03-21 DIAGNOSIS — M353 Polymyalgia rheumatica: Secondary | ICD-10-CM | POA: Diagnosis not present

## 2017-03-21 DIAGNOSIS — I10 Essential (primary) hypertension: Secondary | ICD-10-CM | POA: Diagnosis not present

## 2017-03-21 DIAGNOSIS — E785 Hyperlipidemia, unspecified: Secondary | ICD-10-CM | POA: Diagnosis not present

## 2017-03-21 DIAGNOSIS — E559 Vitamin D deficiency, unspecified: Secondary | ICD-10-CM | POA: Diagnosis not present

## 2017-03-21 DIAGNOSIS — E211 Secondary hyperparathyroidism, not elsewhere classified: Secondary | ICD-10-CM | POA: Diagnosis not present

## 2017-03-21 DIAGNOSIS — D649 Anemia, unspecified: Secondary | ICD-10-CM | POA: Diagnosis not present

## 2017-04-11 DIAGNOSIS — I1310 Hypertensive heart and chronic kidney disease without heart failure, with stage 1 through stage 4 chronic kidney disease, or unspecified chronic kidney disease: Secondary | ICD-10-CM | POA: Diagnosis not present

## 2017-04-11 DIAGNOSIS — M353 Polymyalgia rheumatica: Secondary | ICD-10-CM | POA: Diagnosis not present

## 2017-04-11 DIAGNOSIS — E785 Hyperlipidemia, unspecified: Secondary | ICD-10-CM | POA: Diagnosis not present

## 2017-04-11 DIAGNOSIS — R7303 Prediabetes: Secondary | ICD-10-CM | POA: Diagnosis not present

## 2017-04-11 DIAGNOSIS — N182 Chronic kidney disease, stage 2 (mild): Secondary | ICD-10-CM | POA: Diagnosis not present

## 2017-04-11 DIAGNOSIS — J449 Chronic obstructive pulmonary disease, unspecified: Secondary | ICD-10-CM | POA: Diagnosis not present

## 2017-04-17 ENCOUNTER — Other Ambulatory Visit: Payer: Self-pay

## 2017-04-17 ENCOUNTER — Encounter: Payer: Self-pay | Admitting: Cardiology

## 2017-04-17 ENCOUNTER — Ambulatory Visit (INDEPENDENT_AMBULATORY_CARE_PROVIDER_SITE_OTHER): Payer: Medicare Other | Admitting: Cardiology

## 2017-04-17 VITALS — BP 150/82 | HR 78 | Ht 67.0 in | Wt 170.8 lb

## 2017-04-17 DIAGNOSIS — E088 Diabetes mellitus due to underlying condition with unspecified complications: Secondary | ICD-10-CM | POA: Diagnosis not present

## 2017-04-17 DIAGNOSIS — I10 Essential (primary) hypertension: Secondary | ICD-10-CM | POA: Diagnosis not present

## 2017-04-17 DIAGNOSIS — H349 Unspecified retinal vascular occlusion: Secondary | ICD-10-CM

## 2017-04-17 DIAGNOSIS — I2510 Atherosclerotic heart disease of native coronary artery without angina pectoris: Secondary | ICD-10-CM | POA: Diagnosis not present

## 2017-04-17 HISTORY — DX: Diabetes mellitus due to underlying condition with unspecified complications: E08.8

## 2017-04-17 MED ORDER — NITROGLYCERIN 0.4 MG SL SUBL: 0.4000 mg | SUBLINGUAL_TABLET | SUBLINGUAL | 6 refills | Status: DC | PRN

## 2017-04-17 NOTE — Patient Instructions (Signed)
Medication Instructions:  Your physician has recommended you make the following change in your medication:  START Nitroglycerin 0.4 mg sublingual (under your tongue) as needed for chest pain. If experiencing chest pain, stop what you are doing and sit down. Take 1 nitroglycerin and wait 5 minutes. If chest pain continues, take another nitroglycerin and wait 5 minutes. If chest pain does not subside, take 1 more nitroglycerin and dial 911. You make take a total of 3 nitroglycerin in a 15 minute time frame.  Labwork: None  Testing/Procedures: None  Follow-Up: Your physician recommends that you schedule a follow-up appointment in: 8 months  Any Other Special Instructions Will Be Listed Below (If Applicable).     If you need a refill on your cardiac medications before your next appointment, please call your pharmacy.   [facility]  Garey HamAshley A, RN, BSN

## 2017-04-17 NOTE — Addendum Note (Signed)
Addended by: Craige CottaANDERSON, ASHLEY S on: 04/17/2017 02:41 PM   Modules accepted: Orders

## 2017-04-17 NOTE — Progress Notes (Signed)
Cardiology Office Note:    Date:  04/17/2017   ID:  Christopher Kline, DOB Sep 20, 1930, MRN [id number]  PCP:  Doreene Elandhomas, Millard B, MD  Cardiologist:  Garwin Brothersajan R Kaylin Schellenberg, MD   Referring MD: Doreene Elandhomas, Millard B, MD    ASSESSMENT:    1. Coronary artery disease involving native coronary artery of native heart without angina pectoris   2. Essential hypertension   3. Diabetes mellitus due to underlying condition with complication, without long-term current use of insulin (HCC)   4. Retinal artery occlusion    PLAN:    In order of problems listed above:  1. Secondary prevention stressed with the patient.  Importance of compliance with diet and medications.  And he vocalized understanding.  His wife keeps meticulous track of his blood pressures and they are fine at home.  He has whitecoat hypertension.  His lipids are followed by his primary care physician.  Importance of regular exercise stressed he has begun an exercise program in the past couple of days.Patient will be seen in follow-up appointment in 8 months or earlier if the patient has any concerns    Medication Adjustments/Labs and Tests Ordered: Current medicines are reviewed at length with the patient today.  Concerns regarding medicines are outlined above.  No orders of the defined types were placed in this encounter.  Meds ordered this encounter  Medications  . nitroGLYCERIN (NITROSTAT) 0.4 MG SL tablet    Sig: Place 1 tablet (0.4 mg total) under the tongue every 5 (five) minutes as needed.    Dispense:  11 tablet    Refill:  6     Chief Complaint  Patient presents with  . Follow-up  . Coronary Artery Disease     History of Present Illness:    Christopher Kline is a 84 y.o. male.  Patient has known coronary artery disease.  He denies any problems at this time and takes care of activities of daily living.  No chest pain orthopnea or PND.  He has orthopedic issues with his back which is why he does not ambulate much.  At the time of  my evaluation, the patient is alert awake oriented and in no distress.  Past Medical History:  Diagnosis Date  . CAD (coronary artery disease)   . Chronic kidney disease   . COPD (chronic obstructive pulmonary disease) (HCC)   . Glaucoma   . Hyperlipidemia   . Hypertension   . SOB (shortness of breath)     Past Surgical History:  Procedure Laterality Date  . CORONARY ARTERY BYPASS GRAFT      Current Medications: Current Meds  Medication Sig  . aspirin EC 81 MG tablet Take 81 mg by mouth daily.  Marland Kitchen. atorvastatin (LIPITOR) 80 MG tablet Take 40 mg by mouth daily.  . carvedilol (COREG) 6.25 MG tablet Take 6.25 mg by mouth daily.  . Cholecalciferol (VITAMIN D-1000 MAX ST) 1000 units tablet Take by mouth.  . DOCOSAHEXAENOIC ACID PO Take 1 g by mouth.  . dorzolamide (TRUSOPT) 2 % ophthalmic solution Place 1 drop into both eyes 2 (two) times daily.  . Ergocalciferol (VITAMIN D2) 2000 units TABS Take 1 tablet by mouth daily.  Marland Kitchen. latanoprost (XALATAN) 0.005 % ophthalmic solution Place 1 drop into both eyes daily.  Marland Kitchen. lisinopril (PRINIVIL,ZESTRIL) 20 MG tablet Take 1 tablet (20 mg total) by mouth 2 (two) times daily.  . Multiple Vitamin (MULTIVITAMIN) tablet Take 1 tablet by mouth daily.  . Omega-3 Fatty Acids (OMEGA-3 FISH  OIL) 1200 MG CAPS Take 1 capsule by mouth daily.  . Potassium 99 MG TABS Take 1 tablet by mouth daily.  . potassium chloride (K-DUR,KLOR-CON) 10 MEQ tablet Take by mouth.  . predniSONE (DELTASONE) 1 MG tablet Take 3 tablets by mouth daily.  Marland Kitchen PROAIR HFA 108 (90 Base) MCG/ACT inhaler   . Tiotropium Bromide-Olodaterol (STIOLTO RESPIMAT) 2.5-2.5 MCG/ACT AERS Inhale 2 puffs into the lungs daily.  . vitamin E 1000 UNIT capsule Take by mouth.     Allergies:   Vancomycin   Social History   Socioeconomic History  . Marital status: Married    Spouse name: None  . Number of children: None  . Years of education: None  . Highest education level: None  Social Needs  .  Financial resource strain: None  . Food insecurity - worry: None  . Food insecurity - inability: None  . Transportation needs - medical: None  . Transportation needs - non-medical: None  Occupational History  . None  Tobacco Use  . Smoking status: Former Games developer  . Smokeless tobacco: Never Used  Substance and Sexual Activity  . Alcohol use: No  . Drug use: No  . Sexual activity: None  Other Topics Concern  . None  Social History Narrative  . None     Family History: The patient's family history is not on file.  ROS:   Please see the history of present illness.    All other systems reviewed and are negative.  EKGs/Labs/Other Studies Reviewed:    The following studies were reviewed today: I reviewed previous office visit records and discussed with the patient at length.   Recent Labs: No results found for requested labs within last 8760 hours.  Recent Lipid Panel No results found for: CHOL, TRIG, HDL, CHOLHDL, VLDL, LDLCALC, LDLDIRECT  Physical Exam:    VS:  BP (!) 150/82 (BP Location: Right Arm, Patient Position: Sitting, Cuff Size: Normal)   Pulse 78   Ht 5\' 7"  (1.702 m)   Wt 170 lb 12.8 oz (77.5 kg)   SpO2 98%   BMI 26.75 kg/m     Wt Readings from Last 3 Encounters:  04/17/17 170 lb 12.8 oz (77.5 kg)  10/17/16 170 lb 12.8 oz (77.5 kg)     GEN: Patient is in no acute distress HEENT: Normal NECK: No JVD; No carotid bruits LYMPHATICS: No lymphadenopathy CARDIAC: Hear sounds regular, 2/6 systolic murmur at the apex. RESPIRATORY:  Clear to auscultation without rales, wheezing or rhonchi  ABDOMEN: Soft, non-tender, non-distended MUSCULOSKELETAL:  No edema; No deformity  SKIN: Warm and dry NEUROLOGIC:  Alert and oriented x 3 PSYCHIATRIC:  Normal affect   Signed, Garwin Brothers, MD  04/17/2017 2:37 PM    [facility]

## 2017-05-16 DIAGNOSIS — H2513 Age-related nuclear cataract, bilateral: Secondary | ICD-10-CM | POA: Diagnosis not present

## 2017-05-16 DIAGNOSIS — H401193 Primary open-angle glaucoma, unspecified eye, severe stage: Secondary | ICD-10-CM | POA: Diagnosis not present

## 2017-05-19 ENCOUNTER — Other Ambulatory Visit: Payer: Self-pay

## 2017-05-19 ENCOUNTER — Telehealth: Payer: Self-pay | Admitting: Cardiology

## 2017-05-19 MED ORDER — CARVEDILOL 6.25 MG PO TABS: 6.2500 mg | ORAL_TABLET | Freq: Every day | ORAL | 1 refills | Status: DC

## 2017-05-19 MED ORDER — CARVEDILOL 6.25 MG PO TABS: 6.2500 mg | ORAL_TABLET | Freq: Every day | ORAL | 3 refills | Status: DC

## 2017-05-19 NOTE — Telephone Encounter (Signed)
Please call about carbedilol script

## 2017-05-19 NOTE — Telephone Encounter (Signed)
Refill was sent for this medication; patient intends to call [facility] to inform them that they have transitioned their care to our office.

## 2017-06-16 DIAGNOSIS — M353 Polymyalgia rheumatica: Secondary | ICD-10-CM | POA: Diagnosis not present

## 2017-06-16 DIAGNOSIS — Z79899 Other long term (current) drug therapy: Secondary | ICD-10-CM | POA: Diagnosis not present

## 2017-06-26 ENCOUNTER — Other Ambulatory Visit: Payer: Self-pay

## 2017-06-26 MED ORDER — ATORVASTATIN CALCIUM 80 MG PO TABS: 40.0000 mg | ORAL_TABLET | Freq: Every day | ORAL | 1 refills | Status: DC

## 2017-06-28 ENCOUNTER — Other Ambulatory Visit: Payer: Self-pay

## 2017-06-28 MED ORDER — ATORVASTATIN CALCIUM 80 MG PO TABS: 40.0000 mg | ORAL_TABLET | Freq: Every day | ORAL | 3 refills | Status: DC

## 2017-07-13 ENCOUNTER — Other Ambulatory Visit: Payer: Self-pay

## 2017-07-13 MED ORDER — LISINOPRIL 20 MG PO TABS: 20.0000 mg | ORAL_TABLET | Freq: Two times a day (BID) | ORAL | 2 refills | Status: DC

## 2017-07-13 MED ORDER — CARVEDILOL 6.25 MG PO TABS: 6.2500 mg | ORAL_TABLET | Freq: Two times a day (BID) | ORAL | 1 refills | Status: DC

## 2017-08-01 DIAGNOSIS — H401132 Primary open-angle glaucoma, bilateral, moderate stage: Secondary | ICD-10-CM | POA: Diagnosis not present

## 2017-08-01 DIAGNOSIS — H2511 Age-related nuclear cataract, right eye: Secondary | ICD-10-CM | POA: Diagnosis not present

## 2017-08-01 DIAGNOSIS — H2513 Age-related nuclear cataract, bilateral: Secondary | ICD-10-CM | POA: Diagnosis not present

## 2017-09-13 DIAGNOSIS — E559 Vitamin D deficiency, unspecified: Secondary | ICD-10-CM | POA: Diagnosis not present

## 2017-09-13 DIAGNOSIS — N189 Chronic kidney disease, unspecified: Secondary | ICD-10-CM | POA: Diagnosis not present

## 2017-09-13 DIAGNOSIS — M353 Polymyalgia rheumatica: Secondary | ICD-10-CM | POA: Diagnosis not present

## 2017-09-13 DIAGNOSIS — I10 Essential (primary) hypertension: Secondary | ICD-10-CM | POA: Diagnosis not present

## 2017-09-13 DIAGNOSIS — R809 Proteinuria, unspecified: Secondary | ICD-10-CM | POA: Diagnosis not present

## 2017-09-13 DIAGNOSIS — E785 Hyperlipidemia, unspecified: Secondary | ICD-10-CM | POA: Diagnosis not present

## 2017-09-13 DIAGNOSIS — D649 Anemia, unspecified: Secondary | ICD-10-CM | POA: Diagnosis not present

## 2017-09-13 DIAGNOSIS — R309 Painful micturition, unspecified: Secondary | ICD-10-CM | POA: Diagnosis not present

## 2017-09-18 DIAGNOSIS — H2511 Age-related nuclear cataract, right eye: Secondary | ICD-10-CM | POA: Diagnosis not present

## 2017-09-19 DIAGNOSIS — H2512 Age-related nuclear cataract, left eye: Secondary | ICD-10-CM | POA: Diagnosis not present

## 2017-09-25 ENCOUNTER — Telehealth: Payer: Self-pay | Admitting: *Deleted

## 2017-09-25 MED ORDER — ATORVASTATIN CALCIUM 80 MG PO TABS: 40.0000 mg | ORAL_TABLET | Freq: Every day | ORAL | 3 refills | Status: DC

## 2017-09-25 NOTE — Telephone Encounter (Signed)
Needs refill of Atorvastatin 80 mg [facility] rx with 90 day supply

## 2017-10-04 ENCOUNTER — Other Ambulatory Visit: Payer: Self-pay

## 2017-10-10 DIAGNOSIS — R7303 Prediabetes: Secondary | ICD-10-CM | POA: Diagnosis not present

## 2017-10-10 DIAGNOSIS — I1310 Hypertensive heart and chronic kidney disease without heart failure, with stage 1 through stage 4 chronic kidney disease, or unspecified chronic kidney disease: Secondary | ICD-10-CM | POA: Diagnosis not present

## 2017-10-10 DIAGNOSIS — Z9181 History of falling: Secondary | ICD-10-CM | POA: Diagnosis not present

## 2017-10-10 DIAGNOSIS — Z1331 Encounter for screening for depression: Secondary | ICD-10-CM | POA: Diagnosis not present

## 2017-10-10 DIAGNOSIS — M353 Polymyalgia rheumatica: Secondary | ICD-10-CM | POA: Diagnosis not present

## 2017-10-10 DIAGNOSIS — E785 Hyperlipidemia, unspecified: Secondary | ICD-10-CM | POA: Diagnosis not present

## 2017-10-10 DIAGNOSIS — J449 Chronic obstructive pulmonary disease, unspecified: Secondary | ICD-10-CM | POA: Diagnosis not present

## 2017-10-16 ENCOUNTER — Encounter: Payer: Self-pay | Admitting: Internal Medicine

## 2017-10-16 DIAGNOSIS — J449 Chronic obstructive pulmonary disease, unspecified: Secondary | ICD-10-CM | POA: Diagnosis not present

## 2017-10-17 DIAGNOSIS — H401193 Primary open-angle glaucoma, unspecified eye, severe stage: Secondary | ICD-10-CM | POA: Diagnosis not present

## 2017-11-02 DIAGNOSIS — R0600 Dyspnea, unspecified: Secondary | ICD-10-CM | POA: Diagnosis not present

## 2017-11-02 DIAGNOSIS — R0981 Nasal congestion: Secondary | ICD-10-CM | POA: Diagnosis not present

## 2017-11-02 DIAGNOSIS — J342 Deviated nasal septum: Secondary | ICD-10-CM | POA: Diagnosis not present

## 2017-11-02 DIAGNOSIS — H9193 Unspecified hearing loss, bilateral: Secondary | ICD-10-CM | POA: Diagnosis not present

## 2017-11-02 DIAGNOSIS — R0989 Other specified symptoms and signs involving the circulatory and respiratory systems: Secondary | ICD-10-CM | POA: Diagnosis not present

## 2017-11-02 DIAGNOSIS — J449 Chronic obstructive pulmonary disease, unspecified: Secondary | ICD-10-CM | POA: Diagnosis not present

## 2017-11-02 DIAGNOSIS — Z87891 Personal history of nicotine dependence: Secondary | ICD-10-CM | POA: Diagnosis not present

## 2017-11-09 DIAGNOSIS — R0601 Orthopnea: Secondary | ICD-10-CM | POA: Diagnosis not present

## 2017-11-09 DIAGNOSIS — R0981 Nasal congestion: Secondary | ICD-10-CM | POA: Diagnosis not present

## 2017-11-09 DIAGNOSIS — J449 Chronic obstructive pulmonary disease, unspecified: Secondary | ICD-10-CM | POA: Diagnosis not present

## 2017-11-16 ENCOUNTER — Ambulatory Visit (INDEPENDENT_AMBULATORY_CARE_PROVIDER_SITE_OTHER): Payer: Medicare Other | Admitting: Internal Medicine

## 2017-11-16 ENCOUNTER — Encounter: Payer: Self-pay | Admitting: Internal Medicine

## 2017-11-16 VITALS — BP 134/84 | HR 89 | Ht 66.5 in | Wt 168.0 lb

## 2017-11-16 DIAGNOSIS — J449 Chronic obstructive pulmonary disease, unspecified: Secondary | ICD-10-CM | POA: Insufficient documentation

## 2017-11-16 DIAGNOSIS — I10 Essential (primary) hypertension: Secondary | ICD-10-CM | POA: Diagnosis not present

## 2017-11-16 DIAGNOSIS — I2510 Atherosclerotic heart disease of native coronary artery without angina pectoris: Secondary | ICD-10-CM

## 2017-11-16 HISTORY — DX: Chronic obstructive pulmonary disease, unspecified: J44.9

## 2017-11-16 MED ORDER — TIOTROPIUM BROMIDE-OLODATEROL 2.5-2.5 MCG/ACT IN AERS: 2.0000 | INHALATION_SPRAY | Freq: Every day | RESPIRATORY_TRACT | 0 refills | Status: DC

## 2017-11-16 MED ORDER — FAMOTIDINE 20 MG PO TABS: ORAL_TABLET | ORAL | 11 refills | Status: DC

## 2017-11-16 MED ORDER — TELMISARTAN 80 MG PO TABS: 80.0000 mg | ORAL_TABLET | Freq: Every day | ORAL | 2 refills | Status: DC

## 2017-11-16 MED ORDER — PANTOPRAZOLE SODIUM 40 MG PO TBEC: 40.0000 mg | DELAYED_RELEASE_TABLET | Freq: Every day | ORAL | 2 refills | Status: DC

## 2017-11-16 NOTE — Patient Instructions (Addendum)
Stop fish oil and lisinopril   micardis 80 mg daily will replace lisinopril  Pantoprazole (protonix) 40 mg   Take  30-60 min before first meal of the day and Pepcid (famotidine)  20 mg one @  bedtime until return to office - this is the best way to tell whether stomach acid is contributing to your problem.    GERD (REFLUX)  is an extremely common cause of respiratory symptoms just like yours , many times with no obvious heartburn at all.    It can be treated with medication, but also with lifestyle changes including elevation of the head of your bed (ideally with 6 inch  bed blocks),  Smoking cessation, avoidance of late meals, excessive alcohol, and avoid fatty foods, chocolate, peppermint, colas, red wine, and acidic juices such as orange juice.  NO MINT OR MENTHOL PRODUCTS SO NO COUGH DROPS   USE SUGARLESS CANDY INSTEAD (Jolley ranchers or Stover's or Life Savers) or even ice chips will also do - the key is to swallow to prevent all throat clearing. NO OIL BASED VITAMINS - use powdered substitutes.    Continue stiolto 2 pffs each am   Only use your albuterol as a rescue medication to be used if you can't catch your breath by resting or doing a relaxed purse lip breathing pattern.  - The less you use it, the better it will work when you need it. - Ok to use up to 2 puffs  every 4 hours if you must but call for immediate appointment if use goes up over your usual need - Don't leave home without it !!  (think of it like the spare tire for your car)    Please schedule a follow up office visit in 4 weeks, sooner if needed  with all medications /inhalers/ solutions in hand so we can verify exactly what you are taking. This includes all medications from all doctors and over the counters - pfts on return

## 2017-11-16 NOTE — Progress Notes (Addendum)
Christopher Kline, male    DOB: 15-Oct-1930, 84 y.o.   MRN: [id number]   Brief patient profile:  87 yowm quit smoking 2008 at [facility]G in [facility] and did well but around 2016 dx and copd and rx spriva and bad to worse since 05/2017 assoc with hoarseness and no better since on prednisone for PMR so referred to pulmonary clinic 11/16/2017 by [name] with documented GOLD III criteria at initial ov     11/16/2017   Pulmonary / 1st office eval  Chief Complaint  Patient presents with  . Pulmonary Consult    Referred by [name], PA. Pt states he was dxed with COPD 2 years ago. Pt c/o increased DOE for the past 6 months. He gets winded walking short distances such as from lobby to exam room today.   Dyspnea:  MMRC3 = can't walk 100 yards even at a slow pace at a flat grade s stopping due to sob   Cough: minimal dry daytime Sleep: can't lie flat > feel immediately can't breath x 05/2017 "something blocking my breathing in my throat" (neg ent eval in [place])  SABA use: not helping  On pred for pmr not helping breathing.   No obvious day to day or daytime variability or assoc excess/ purulent sputum or mucus plugs or hemoptysis or cp or chest tightness,   overt sinus or hb symptoms.   Sleeping ok only > 45 degrees  without nocturnal  or early am exacerbation  of respiratory  c/o's or need for noct saba. Also denies any obvious fluctuation of symptoms with weather or environmental changes or other aggravating or alleviating factors except as outlined above   No unusual exposure hx or h/o childhood pna/ asthma or knowledge of premature birth.  Current Allergies, Complete Past Medical History, Past Surgical History, Family History, and Social History were reviewed in [employer] record.  ROS  The following are not active complaints unless bolded Hoarseness, sore throat, dysphagia, dental problems, itching, sneezing,  nasal congestion or discharge of excess mucus or purulent  secretions, ear ache,   fever, chills, sweats, unintended wt loss or wt gain, classically pleuritic or exertional cp,  orthopnea pnd or arm/hand swelling  or leg swelling, presyncope, palpitations, abdominal pain, anorexia, nausea, vomiting, diarrhea  or change in bowel habits or change in bladder habits, change in stools or change in urine, dysuria, hematuria,  rash, arthralgias, visual complaints, headache, numbness, weakness or ataxia or problems with walking or coordination,  change in mood or  memory.             Past Medical History:  Diagnosis Date  . CAD (coronary artery disease)   . Chronic kidney disease   . COPD (chronic obstructive pulmonary disease) (HCC)   . Glaucoma   . Hyperlipidemia   . Hypertension   . SOB (shortness of breath)     Outpatient Medications Prior to Visit  Medication Sig Dispense Refill  . aspirin EC 81 MG tablet Take 81 mg by mouth daily.    Marland Kitchen atorvastatin (LIPITOR) 80 MG tablet Take 0.5 tablets (40 mg total) by mouth daily at 6 PM. 45 tablet 3  . carvedilol (COREG) 6.25 MG tablet Take 1 tablet (6.25 mg total) by mouth 2 (two) times daily with a meal. 180 tablet 1  . Cholecalciferol (VITAMIN D-1000 MAX ST) 1000 units tablet Take by mouth.    . DOCOSAHEXAENOIC ACID PO Take 1 g by mouth.    Marland Kitchen  dorzolamide (TRUSOPT) 2 % ophthalmic solution Place 1 drop into both eyes 2 (two) times daily.    . Ergocalciferol (VITAMIN D2) 2000 units TABS Take 1 tablet by mouth daily.    Marland Kitchen latanoprost (XALATAN) 0.005 % ophthalmic solution Place 1 drop into both eyes daily.    Marland Kitchen lisinopril (PRINIVIL,ZESTRIL) 20 MG tablet Take 1 tablet (20 mg total) by mouth 2 (two) times daily. 180 tablet 2  . montelukast (SINGULAIR) 10 MG tablet Take 10 mg by mouth daily.  0  . Multiple Vitamin (MULTIVITAMIN) tablet Take 1 tablet by mouth daily.    . Omega-3 Fatty Acids (OMEGA-3 FISH OIL) 1200 MG CAPS Take 1 capsule by mouth daily.    . predniSONE (DELTASONE) 5 MG tablet Take 7.5 mg by mouth  daily with breakfast. Per Dr. Herma Carson    . PROAIR HFA 108 (90 Base) MCG/ACT inhaler Inhale 2 puffs into the lungs every 4 (four) hours as needed.     . Tiotropium Bromide-Olodaterol (STIOLTO RESPIMAT) 2.5-2.5 MCG/ACT AERS Inhale 2 puffs into the lungs daily.    . nitroGLYCERIN (NITROSTAT) 0.4 MG SL tablet Place 1 tablet (0.4 mg total) under the tongue every 5 (five) minutes as needed. 11 tablet 6  . Potassium 99 MG TABS Take 1 tablet by mouth daily.    . potassium chloride (K-DUR,KLOR-CON) 10 MEQ tablet Take by mouth.    . predniSONE (DELTASONE) 1 MG tablet Take 3 tablets by mouth daily.    . vitamin E 1000 UNIT capsule Take by mouth.     No facility-administered medications prior to visit.               Objective:     BP 134/84 (BP Location: Left Arm, Cuff Size: Normal)   Pulse 89   Ht 5' 6.5" (1.689 m)   Wt 168 lb (76.2 kg)   SpO2 92%   BMI 26.71 kg/m   SpO2: 92 % RA   HEENT: nl dentition / oropharynx. Nl external ear canals without cough reflex -  Mild bilateral non-specific turbinate edema     NECK :  without JVD/Nodes/TM/ nl carotid upstrokes bilaterally   LUNGS: no acc muscle use,  Mod barrel  contour chest wall with bilateral  Distant bs s audible wheeze and  without cough on insp or exp maneuver and mod  Hyperresonant  to  percussion bilaterally     CV:  RRR  no s3 or murmur or increase in P2, and no edema   ABD:  soft and nontender with pos mid insp Hoover's  in the supine position. No bruits or organomegaly appreciated, bowel sounds nl  MS:   Nl gait/  ext warm without deformities, calf tenderness, cyanosis or clubbing No obvious joint restrictions   SKIN: warm and dry without lesions    NEURO:  alert, approp, nl sensorium with  no motor or cerebellar deficits apparent.        I personally reviewed   radiology impression as follows:  CXR:   10/16/17 : chronic appearing parenchymal chantes / min pleural calcification      Assessment   COPD GOLD  III Spirometry 11/16/2017  FEV1 0.9 (42%)  Ratio 50 with classic curvature p stiolto x one puff prior  - 11/16/2017  After extensive coaching inhaler device,  effectiveness =    75% with smi > change stiolto to 2 pffs each am and try off acei/ on gerd rx     Although he appears to have severe copd,  When respiratory symptoms begin or become refractory well after a patient reports complete smoking cessation,  Especially when this wasn't the case while they were smoking, a red flag is raised based on the work of Dr Primitivo GauzeFletcher which states:   if you quit smoking when your best day FEV1 is still  preserved it is highly unlikely you will progress to severe disease - I suspect he's always had moderately severe but well compensated copd dating back 10 years but that doesn't explain his symptoms now.  That is to say, once the smoking stops,  the symptoms should not suddenly erupt or markedly worsen.  If so, the differential diagnosis should include  obesity/deconditioning,  LPR/Reflux/Aspiration syndromes,  occult CHF, or    side effect of medications commonly used in this population, esp BB and ACEi, both of which may apply here (see hbp)  rec max rx for gerd, trial off acei then perhaps coreg and f/u in  4 weeks with all meds in hand using a trust but verify approach to confirm accurate Medication  Reconciliation The principal here is that until we are certain that the  patients are doing what we've asked, it makes no sense to ask them to do more.   Essential hypertension In the best review of chronic cough to date ( NEJM 2016 375 [id number]) ,  ACEi are now felt to cause cough in up to  20% of pts which is a 4 fold increase from previous reports and does not include the variety of non-specific complaints we see in pulmonary clinic in pts on ACEi but previously attributed to another dx like  Copd/asthma and  include PNDS, throat and chest congestion, "bronchitis", unexplained dyspnea and noct "strangling"  sensations, and hoarseness, but also  atypical /refractory GERD symptoms like dysphagia and "bad heartburn"   The only way I know  to prove this is not an "ACEi Case" is a trial off ACEi x a minimum of 6 weeks then regroup.   We'll try on micardis 80 mg daily and f/u in 4 weeks to see if he's making progress and consider: In the setting of respiratory symptoms of unknown etiology,  It would be preferable to use bystolic, the most beta -1  selective Beta blocker available in sample form, with bisoprolol the most selective generic choice  on the market, at least on a trial basis, to make sure the spillover Beta 2 effects of the less specific Beta blockers are not contributing to this patient's symptoms.      Total time devoted to counseling  > 50 % of initial 60 min office visit:  review case with pt/wife discussion of options/alternatives/ personally creating written customized instructions  in presence of pt  then going over those specific  Instructions directly with the pt including how to use all of the meds but in particular covering each new medication in detail and the difference between the maintenance= "automatic" meds and the prns using an action plan format for the latter (If this problem/symptom => do that organization reading Left to right).  Please see AVS from this visit for a full list of these instructions which I personally wrote for this pt and  are unique to this visit.   See device teaching which extended face to face time for this visit      Sandrea HughsMichael Levern Kalka, MD 11/16/2017

## 2017-11-17 ENCOUNTER — Encounter: Payer: Self-pay | Admitting: Internal Medicine

## 2017-11-17 NOTE — Assessment & Plan Note (Addendum)
In the best review of chronic cough to date ( NEJM 2016 375 [phone]) ,  ACEi are now felt to cause cough in up to  20% of pts which is a 4 fold increase from previous reports and does not include the variety of non-specific complaints we see in pulmonary clinic in pts on ACEi but previously attributed to another dx like  Copd/asthma and  include PNDS, throat and chest congestion, "bronchitis", unexplained dyspnea and noct "strangling" sensations, and hoarseness, but also  atypical /refractory GERD symptoms like dysphagia and "bad heartburn"   The only way I know  to prove this is not an "ACEi Case" is a trial off ACEi x a minimum of 6 weeks then regroup.   We'll try on micardis 80 mg daily and f/u in 4 weeks to see if he's making progress and consider: In the setting of respiratory symptoms of unknown etiology,  It would be preferable to use bystolic, the most beta -1  selective Beta blocker available in sample form, with bisoprolol the most selective generic choice  on the market, at least on a trial basis, to make sure the spillover Beta 2 effects of the less specific Beta blockers are not contributing to this patient's symptoms.      Total time devoted to counseling  > 50 % of initial 60 min office visit:  review case with pt/wife discussion of options/alternatives/ personally creating written customized instructions  in presence of pt  then going over those specific  Instructions directly with the pt including how to use all of the meds but in particular covering each new medication in detail and the difference between the maintenance= "automatic" meds and the prns using an action plan format for the latter (If this problem/symptom => do that organization reading Left to right).  Please see AVS from this visit for a full list of these instructions which I personally wrote for this pt and  are unique to this visit.   See device teaching which extended face to face time for this visit

## 2017-11-17 NOTE — Assessment & Plan Note (Signed)
Spirometry 11/16/2017  FEV1 0.9 (42%)  Ratio 50 with classic curvature p stiolto x one puff prior  - 11/16/2017  After extensive coaching inhaler device,  effectiveness =    75% with smi > change stiolto to 2 pffs each am and try off acei/ on gerd rx     Although he appears to have severe copd,  When respiratory symptoms begin or become refractory well after a patient reports complete smoking cessation,  Especially when this wasn't the case while they were smoking, a red flag is raised based on the work of Dr Primitivo GauzeFletcher which states:   if you quit smoking when your best day FEV1 is still  preserved it is highly unlikely you will progress to severe disease - I suspect he's always had moderately severe but well compensated copd dating back 10 years but that doesn't explain his symptoms now.  That is to say, once the smoking stops,  the symptoms should not suddenly erupt or markedly worsen.  If so, the differential diagnosis should include  obesity/deconditioning,  LPR/Reflux/Aspiration syndromes,  occult CHF, or    side effect of medications commonly used in this population, esp BB and ACEi, both of which may apply here (see hbp)  rec max rx for gerd, trial off acei then perhaps coreg and f/u in  4 weeks with all meds in hand using a trust but verify approach to confirm accurate Medication  Reconciliation The principal here is that until we are certain that the  patients are doing what we've asked, it makes no sense to ask them to do more.

## 2017-11-22 DIAGNOSIS — Z23 Encounter for immunization: Secondary | ICD-10-CM | POA: Diagnosis not present

## 2017-12-01 ENCOUNTER — Other Ambulatory Visit: Payer: Self-pay | Admitting: Cardiology

## 2017-12-01 DIAGNOSIS — J449 Chronic obstructive pulmonary disease, unspecified: Secondary | ICD-10-CM | POA: Diagnosis not present

## 2017-12-14 DIAGNOSIS — G4733 Obstructive sleep apnea (adult) (pediatric): Secondary | ICD-10-CM | POA: Diagnosis not present

## 2017-12-15 ENCOUNTER — Encounter: Payer: Self-pay | Admitting: Cardiology

## 2017-12-15 ENCOUNTER — Other Ambulatory Visit: Payer: Self-pay | Admitting: Internal Medicine

## 2017-12-15 ENCOUNTER — Ambulatory Visit (INDEPENDENT_AMBULATORY_CARE_PROVIDER_SITE_OTHER): Payer: Medicare Other | Admitting: Cardiology

## 2017-12-15 VITALS — BP 126/74 | HR 71 | Ht 66.5 in | Wt 172.0 lb

## 2017-12-15 DIAGNOSIS — I10 Essential (primary) hypertension: Secondary | ICD-10-CM

## 2017-12-15 DIAGNOSIS — R0600 Dyspnea, unspecified: Secondary | ICD-10-CM

## 2017-12-15 DIAGNOSIS — R0609 Other forms of dyspnea: Secondary | ICD-10-CM | POA: Diagnosis not present

## 2017-12-15 DIAGNOSIS — I2510 Atherosclerotic heart disease of native coronary artery without angina pectoris: Secondary | ICD-10-CM | POA: Diagnosis not present

## 2017-12-15 DIAGNOSIS — E088 Diabetes mellitus due to underlying condition with unspecified complications: Secondary | ICD-10-CM

## 2017-12-15 DIAGNOSIS — J449 Chronic obstructive pulmonary disease, unspecified: Secondary | ICD-10-CM

## 2017-12-15 HISTORY — DX: Other forms of dyspnea: R06.09

## 2017-12-15 HISTORY — DX: Dyspnea, unspecified: R06.00

## 2017-12-15 NOTE — Progress Notes (Signed)
Cardiology Office Note:    Date:  12/15/2017   ID:  Christopher Kline, DOB Jun 01, 1930, MRN [id number]  PCP:  Marlyn Corporal, PA  Cardiologist:  Garwin Brothers, MD   Referring MD: Doreene Eland, MD    ASSESSMENT:    1. DOE (dyspnea on exertion)   2. Coronary artery disease involving native coronary artery of native heart without angina pectoris   3. Essential hypertension   4. Diabetes mellitus due to underlying condition with unspecified complications (HCC)    PLAN:    In order of problems listed above:  1. Secondary prevention stressed with the patient.  Importance of compliance with diet and medication stressed and he vocalized understanding.  His blood pressure is stable.  Diet was discussed with dyslipidemia and diabetes mellitus. 2. His symptoms of shortness of breath concerning especially in view of the fact that he has known coronary artery disease.  For this reason we will do a Lexiscan sestamibi. 3. Echocardiogram will be done to assess murmur heard in auscultation. 4. Patient will be seen in follow-up appointment in 6 months or earlier if the patient has any concerns.  He knows to go to the nearest emergency room for any concerning symptoms. 5. Sublingual nitroglycerin prescription was sent, its protocol and 911 protocol explained and the patient vocalized understanding questions were answered to the patient's satisfaction   Medication Adjustments/Labs and Tests Ordered: Current medicines are reviewed at length with the patient today.  Concerns regarding medicines are outlined above.  No orders of the defined types were placed in this encounter.  No orders of the defined types were placed in this encounter.    No chief complaint on file.    History of Present Illness:    Christopher Kline is a 84 y.o. male.  The patient has history of coronary artery disease, essential hypertension and diabetes mellitus.  He mentions to me that he has been noticing shortness of  breath on exertion.  He is being evaluated by a pulmonologist also for this.  There is history of some renal insufficiency but I do not have the details.  Patient and his wife are concerned that his shortness of breath is getting fairly significant.  No orthopnea or PND.  At the time of my evaluation, the patient is alert awake oriented and in no distress.  Past Medical History:  Diagnosis Date  . CAD (coronary artery disease)   . Chronic kidney disease   . COPD (chronic obstructive pulmonary disease) (HCC)   . Glaucoma   . Hyperlipidemia   . Hypertension   . SOB (shortness of breath)     Past Surgical History:  Procedure Laterality Date  . CORONARY ARTERY BYPASS GRAFT      Current Medications: Current Meds  Medication Sig  . aspirin EC 81 MG tablet Take 81 mg by mouth daily.  Marland Kitchen atorvastatin (LIPITOR) 80 MG tablet Take 0.5 tablets (40 mg total) by mouth daily at 6 PM.  . carvedilol (COREG) 6.25 MG tablet TAKE 1 TABLET BY MOUTH TWO  TIMES DAILY WITH A MEAL  . Cholecalciferol (VITAMIN D-1000 MAX ST) 1000 units tablet Take by mouth.  . DOCOSAHEXAENOIC ACID PO Take 1 g by mouth.  . dorzolamide (TRUSOPT) 2 % ophthalmic solution Place 1 drop into both eyes 2 (two) times daily.  . Ergocalciferol (VITAMIN D2) 2000 units TABS Take 1 tablet by mouth daily.  . famotidine (PEPCID) 20 MG tablet One at bedtime  .  latanoprost (XALATAN) 0.005 % ophthalmic solution Place 1 drop into both eyes daily.  . montelukast (SINGULAIR) 10 MG tablet Take 10 mg by mouth daily.  . Multiple Vitamin (MULTIVITAMIN) tablet Take 1 tablet by mouth daily.  . pantoprazole (PROTONIX) 40 MG tablet Take 1 tablet (40 mg total) by mouth daily. Take 30-60 min before first meal of the day  . predniSONE (DELTASONE) 5 MG tablet Take 7.5 mg by mouth daily with breakfast. Per Dr. Herma Carson  . PROAIR HFA 108 (90 Base) MCG/ACT inhaler Inhale 2 puffs into the lungs every 4 (four) hours as needed.   Marland Kitchen telmisartan (MICARDIS) 80 MG tablet Take  1 tablet (80 mg total) by mouth daily.  . Tiotropium Bromide-Olodaterol (STIOLTO RESPIMAT) 2.5-2.5 MCG/ACT AERS Inhale 2 puffs into the lungs daily.     Allergies:   Vancomycin   Social History   Socioeconomic History  . Marital status: Married    Spouse name: Not on file  . Number of children: Not on file  . Years of education: Not on file  . Highest education level: Not on file  Occupational History  . Not on file  Social Needs  . Financial resource strain: Not on file  . Food insecurity:    Worry: Not on file    Inability: Not on file  . Transportation needs:    Medical: Not on file    Non-medical: Not on file  Tobacco Use  . Smoking status: Former Smoker    Packs/day: 1.00    Years: 65.00    Pack years: 65.00    Types: Cigarettes, Pipe    Last attempt to quit: 03/07/2006    Years since quitting: 11.7  . Smokeless tobacco: Never Used  Substance and Sexual Activity  . Alcohol use: No  . Drug use: No  . Sexual activity: Not on file  Lifestyle  . Physical activity:    Days per week: Not on file    Minutes per session: Not on file  . Stress: Not on file  Relationships  . Social connections:    Talks on phone: Not on file    Gets together: Not on file    Attends religious service: Not on file    Active member of club or organization: Not on file    Attends meetings of clubs or organizations: Not on file    Relationship status: Not on file  Other Topics Concern  . Not on file  Social History Narrative  . Not on file     Family History: The patient's family history is not on file.  ROS:   Please see the history of present illness.    All other systems reviewed and are negative.  EKGs/Labs/Other Studies Reviewed:    The following studies were reviewed today: I discussed my findings with the patient at length.   Recent Labs: No results found for requested labs within last 8760 hours.  Recent Lipid Panel No results found for: CHOL, TRIG, HDL, CHOLHDL,  VLDL, LDLCALC, LDLDIRECT  Physical Exam:    VS:  BP 126/74 (BP Location: Right Arm, Patient Position: Sitting, Cuff Size: Normal)   Pulse 71   Ht 5' 6.5" (1.689 m)   Wt 172 lb (78 kg)   SpO2 94%   BMI 27.35 kg/m     Wt Readings from Last 3 Encounters:  12/15/17 172 lb (78 kg)  11/16/17 168 lb (76.2 kg)  04/17/17 170 lb 12.8 oz (77.5 kg)     GEN: Patient  is in no acute distress HEENT: Normal NECK: No JVD; No carotid bruits LYMPHATICS: No lymphadenopathy CARDIAC: Hear sounds regular, 2/6 systolic murmur at the apex. RESPIRATORY:  Clear to auscultation without rales, wheezing or rhonchi  ABDOMEN: Soft, non-tender, non-distended MUSCULOSKELETAL:  No edema; No deformity  SKIN: Warm and dry NEUROLOGIC:  Alert and oriented x 3 PSYCHIATRIC:  Normal affect   Signed, Garwin Brothers, MD  12/15/2017 10:29 AM    [facility]

## 2017-12-15 NOTE — Addendum Note (Signed)
Addended by: Craige Cotta on: 12/15/2017 10:55 AM   Modules accepted: Orders

## 2017-12-15 NOTE — Patient Instructions (Signed)
Medication Instructions:  Your physician recommends that you continue on your current medications as directed. Please refer to the Current Medication list given to you today.  If you need a refill on your cardiac medications before your next appointment, please call your pharmacy.   Lab work: None  If you have labs (blood work) drawn today and your tests are completely normal, you will receive your results only by: . MyChart Message (if you have MyChart) OR . A paper copy in the mail If you have any lab test that is abnormal or we need to change your treatment, we will call you to review the results.  Testing/Procedures: Your physician has requested that you have an echocardiogram. Echocardiography is a painless test that uses sound waves to create images of your heart. It provides your doctor with information about the size and shape of your heart and how well your heart's chambers and valves are working. This procedure takes approximately one hour. There are no restrictions for this procedure.   Your physician has requested that you have a lexiscan myoview. For further information please visit [email or web address]. Please follow instruction sheet, as given.  Follow-Up: At [facility], you and your health needs are our priority.  As part of our continuing mission to provide you with exceptional heart care, we have created designated Provider Care Teams.  These Care Teams include your primary Cardiologist (physician) and Advanced Practice Providers (APPs -  Physician Assistants and Nurse Practitioners) who all work together to provide you with the care you need, when you need it.  You will need a follow up appointment in 6 months.  Please call our office 2 months in advance to schedule this appointment.  You may see another member of our [facility] Provider Team in [place]: Robert Krasowski, MD . Brian Munley, MD  Any Other Special Instructions Will Be Listed Below (If  Applicable).    

## 2017-12-18 ENCOUNTER — Encounter: Payer: Self-pay | Admitting: Internal Medicine

## 2017-12-18 ENCOUNTER — Ambulatory Visit (INDEPENDENT_AMBULATORY_CARE_PROVIDER_SITE_OTHER): Payer: Medicare Other | Admitting: Internal Medicine

## 2017-12-18 ENCOUNTER — Ambulatory Visit (INDEPENDENT_AMBULATORY_CARE_PROVIDER_SITE_OTHER)
Admission: RE | Admit: 2017-12-18 | Discharge: 2017-12-18 | Disposition: A | Discharge status: Home / Self Care | Payer: Medicare Other | Source: Ambulatory Visit | Attending: Internal Medicine | Admitting: Internal Medicine

## 2017-12-18 ENCOUNTER — Other Ambulatory Visit (INDEPENDENT_AMBULATORY_CARE_PROVIDER_SITE_OTHER): Payer: Medicare Other

## 2017-12-18 ENCOUNTER — Ambulatory Visit: Payer: Medicare Other | Admitting: Internal Medicine

## 2017-12-18 DIAGNOSIS — J439 Emphysema, unspecified: Secondary | ICD-10-CM | POA: Diagnosis not present

## 2017-12-18 DIAGNOSIS — I10 Essential (primary) hypertension: Secondary | ICD-10-CM

## 2017-12-18 DIAGNOSIS — R058 Other specified cough: Secondary | ICD-10-CM

## 2017-12-18 DIAGNOSIS — R05 Cough: Secondary | ICD-10-CM

## 2017-12-18 DIAGNOSIS — J449 Chronic obstructive pulmonary disease, unspecified: Secondary | ICD-10-CM | POA: Diagnosis not present

## 2017-12-18 DIAGNOSIS — R0609 Other forms of dyspnea: Secondary | ICD-10-CM | POA: Diagnosis not present

## 2017-12-18 DIAGNOSIS — J9611 Chronic respiratory failure with hypoxia: Secondary | ICD-10-CM | POA: Insufficient documentation

## 2017-12-18 DIAGNOSIS — I2510 Atherosclerotic heart disease of native coronary artery without angina pectoris: Secondary | ICD-10-CM

## 2017-12-18 HISTORY — DX: Chronic respiratory failure with hypoxia: J96.11

## 2017-12-18 HISTORY — DX: Other specified cough: R05.8

## 2017-12-18 LAB — CBC WITH DIFFERENTIAL/PLATELET
BASOS ABS: 0.1 10*3/uL (ref 0.0–0.1)
Basophils Relative: 0.8 % (ref 0.0–3.0)
EOS ABS: 0.1 10*3/uL (ref 0.0–0.7)
Eosinophils Relative: 0.6 % (ref 0.0–5.0)
HCT: 46.6 % (ref 39.0–52.0)
Hemoglobin: 15.9 g/dL (ref 13.0–17.0)
LYMPHS ABS: 1.2 10*3/uL (ref 0.7–4.0)
Lymphocytes Relative: 12.6 % (ref 12.0–46.0)
MCHC: 34 g/dL (ref 30.0–36.0)
MCV: 99.2 fl (ref 78.0–100.0)
MONO ABS: 0.8 10*3/uL (ref 0.1–1.0)
MONOS PCT: 8.3 % (ref 3.0–12.0)
NEUTROS PCT: 77.7 % — AB (ref 43.0–77.0)
Neutro Abs: 7.1 10*3/uL (ref 1.4–7.7)
PLATELETS: 250 10*3/uL (ref 150.0–400.0)
RBC: 4.7 Mil/uL (ref 4.22–5.81)
RDW: 12.7 % (ref 11.5–15.5)
WBC: 9.1 10*3/uL (ref 4.0–10.5)

## 2017-12-18 LAB — PULMONARY FUNCTION TEST
DL/VA % pred: 57 %
DL/VA: 2.48 ml/min/mmHg/L
DLCO UNC % PRED: 34 %
DLCO unc: 9.38 ml/min/mmHg
FEF 25-75 PRE: 0.37 L/s
FEF 25-75 Post: 0.43 L/sec
FEF2575-%Change-Post: 16 %
FEF2575-%PRED-POST: 32 %
FEF2575-%Pred-Pre: 27 %
FEV1-%CHANGE-POST: 7 %
FEV1-%Pred-Post: 53 %
FEV1-%Pred-Pre: 50 %
FEV1-POST: 1.15 L
FEV1-Pre: 1.07 L
FEV1FVC-%CHANGE-POST: 1 %
FEV1FVC-%Pred-Pre: 75 %
FEV6-%Change-Post: 2 %
FEV6-%Pred-Post: 71 %
FEV6-%Pred-Pre: 69 %
FEV6-PRE: 2 L
FEV6-Post: 2.04 L
FEV6FVC-%Change-Post: -3 %
FEV6FVC-%PRED-PRE: 107 %
FEV6FVC-%Pred-Post: 103 %
FVC-%CHANGE-POST: 5 %
FVC-%Pred-Post: 68 %
FVC-%Pred-Pre: 64 %
FVC-Post: 2.14 L
FVC-Pre: 2.03 L
POST FEV1/FVC RATIO: 54 %
PRE FEV6/FVC RATIO: 99 %
Post FEV6/FVC ratio: 95 %
Pre FEV1/FVC ratio: 53 %
RV % PRED: 140 %
RV: 3.58 L
TLC % pred: 90 %
TLC: 5.66 L

## 2017-12-18 LAB — BASIC METABOLIC PANEL
BUN: 26 mg/dL — AB (ref 6–23)
CALCIUM: 9.6 mg/dL (ref 8.4–10.5)
CO2: 29 mEq/L (ref 19–32)
CREATININE: 1.43 mg/dL (ref 0.40–1.50)
Chloride: 104 mEq/L (ref 96–112)
GFR: 49.89 mL/min — AB (ref >60.00)
Glucose, Bld: 195 mg/dL — ABNORMAL HIGH (ref 70–99)
Potassium: 4.6 mEq/L (ref 3.5–5.1)
SODIUM: 140 meq/L (ref 135–145)

## 2017-12-18 LAB — BRAIN NATRIURETIC PEPTIDE: Pro B Natriuretic peptide (BNP): 153 pg/mL — ABNORMAL HIGH (ref 0.0–100.0)

## 2017-12-18 LAB — SEDIMENTATION RATE: Sed Rate: 8 mm/hr (ref 0–20)

## 2017-12-18 LAB — TSH: TSH: 1.46 u[IU]/mL (ref 0.35–4.50)

## 2017-12-18 MED ORDER — PANTOPRAZOLE SODIUM 40 MG PO TBEC: DELAYED_RELEASE_TABLET | ORAL | 2 refills | Status: DC

## 2017-12-18 MED ORDER — BISOPROLOL FUMARATE 5 MG PO TABS: 5.0000 mg | ORAL_TABLET | Freq: Every day | ORAL | 11 refills | Status: DC

## 2017-12-18 NOTE — Progress Notes (Signed)
PFT completed today.  

## 2017-12-18 NOTE — Patient Instructions (Addendum)
Ok to use up your stiolto then try off and see what if any change this makes  Increase protonix Take 30- 60 min before your first and last meals of the day    GERD (REFLUX)  is an extremely common cause of respiratory symptoms just like yours , many times with no obvious heartburn at all.    It can be treated with medication, but also with lifestyle changes including elevation of the head of your bed (ideally with 6 inch  bed blocks),  Smoking cessation, avoidance of late meals, excessive alcohol, and avoid fatty foods, chocolate, peppermint, colas, red wine, and acidic juices such as orange juice.  NO MINT OR MENTHOL PRODUCTS SO NO COUGH DROPS  USE SUGARLESS CANDY INSTEAD (Jolley ranchers or Stover's or Life Savers) or even ice chips will also do - the key is to swallow to prevent all throat clearing. NO OIL BASED VITAMINS - use powdered substitutes.    Stop coreg  Start bisoprolol 5 mg daily   Start 02   4 lpm POC with walking, no need at rest or sleeping for now   Please see patient coordinator before you leave today  to schedule ENT eval by Dr Deland Pretty at [facility]    Please remember to go to the lab and x-ray department downstairs in the basement  for your tests - we will call you with the results when they are available and decide on next steps

## 2017-12-18 NOTE — Progress Notes (Signed)
Christopher Kline, male    DOB: 07-06-1930, 84 y.o.   MRN: [id number]     Brief patient profile:  87 yowm quit smoking 2008 at CABG in [facility] and did well but around 2016 dx and copd and rx spriva and bad to worse since 05/2017 assoc with hoarseness and no better since on prednisone for PMR so referred to pulmonary clinic 11/16/2017 by [name] with documented GOLD III criteria at initial ov    History of Present Illness  11/16/2017   Pulmonary / 1st office eval  Chief Complaint  Patient presents with  . Pulmonary Consult    Referred by [name], PA. Pt states he was dxed with COPD 2 years ago. Pt c/o increased DOE for the past 6 months. He gets winded walking short distances such as from lobby to exam room today.   Dyspnea:  MMRC3 = can't walk 100 yards even at a slow pace at a flat grade s stopping due to sob   Cough: minimal dry daytime Sleep: can't lie flat > feel immediately can't breath x 05/2017 "something blocking my breathing in my throat" (neg ent eval in [place])  SABA use: not helping  On pred for pmr not helping breathing. Sleeping ok only > 45 degrees  without nocturnal  or early am exacerbation  of respiratory  c/o's or need for noct saba. Also denies any obvious fluctuation of symptoms with weather or environmental changes or other aggravating or alleviating factors except as outlined above  rec Stop fish oil and lisinopril  micardis 80 mg daily will replace lisinopril Pantoprazole (protonix) 40 mg   Take  30-60 min before first meal of the day and Pepcid (famotidine)  20 mg one @  bedtime until return to office - this is the best way to tell whether stomach acid is contributing to your problem.   GERD Continue stiolto 2 pffs each am  Only use your albuterol as a rescue medication       12/18/2017 extended  f/u ov/[name] re:  Noct "Choking sensation" 2-3 /wk   since 05/2017 with progressive doe x 2016  Chief Complaint  Patient presents with  . Follow-up    PFT's  done. He states his breathing is doing well today. He is using his proair 1 x per wk on average.   Dyspnea:  Lobby to exam room - no better vs last ov Cough: late in pm p supper and 2-3x noct per week= choking Sleeping: flat/ on side  one pillow unless then wakes up an moves to recliner 2-3 x per week and does better in this position  SABA use: maybe once a week and not sure it helps 02: none    No obvious day to day or daytime variability or assoc excess/ purulent sputum or mucus plugs or hemoptysis or cp or chest tightness, subjective wheeze or overt sinus or hb symptoms.     Also denies any obvious fluctuation of symptoms with weather or environmental changes or other aggravating or alleviating factors except as outlined above   No unusual exposure hx or h/o childhood pna/ asthma or knowledge of premature birth.  Current Allergies, Complete Past Medical History, Past Surgical History, Family History, and Social History were reviewed in [employer] record.  ROS  The following are not active complaints unless bolded Hoarseness, sore throat= globus sensation , dysphagia, dental problems, itching, sneezing,  nasal congestion or discharge of excess mucus or purulent secretions, ear ache,  fever, chills, sweats, unintended wt loss or wt gain, classically pleuritic or exertional cp,  orthopnea pnd or arm/hand swelling  or leg swelling, presyncope, palpitations, abdominal pain, anorexia, nausea, vomiting, diarrhea  or change in bowel habits or change in bladder habits, change in stools or change in urine, dysuria, hematuria,  rash, arthralgias, visual complaints, headache, numbness, weakness or ataxia or problems with walking or coordination,  change in mood or  memory.        Current Meds  Medication Sig  . aspirin EC 81 MG tablet Take 81 mg by mouth daily.  Marland Kitchen atorvastatin (LIPITOR) 80 MG tablet Take 0.5 tablets (40 mg total) by mouth daily at 6 PM.  . DOCOSAHEXAENOIC ACID  PO Take 1 g by mouth.  . dorzolamide (TRUSOPT) 2 % ophthalmic solution Place 1 drop into both eyes 2 (two) times daily.  . famotidine (PEPCID) 20 MG tablet One at bedtime  . latanoprost (XALATAN) 0.005 % ophthalmic solution Place 1 drop into both eyes daily.  . montelukast (SINGULAIR) 10 MG tablet Take 10 mg by mouth daily.  . Multiple Vitamin (MULTIVITAMIN) tablet Take 1 tablet by mouth daily.        . predniSONE (DELTASONE) 5 MG tablet Take 7.5 mg by mouth daily with breakfast. Per Dr. Herma Carson  . PROAIR HFA 108 (90 Base) MCG/ACT inhaler Inhale 2 puffs into the lungs every 4 (four) hours as needed.   Marland Kitchen telmisartan (MICARDIS) 80 MG tablet Take 1 tablet (80 mg total) by mouth daily.  . Tiotropium Bromide-Olodaterol (STIOLTO RESPIMAT) 2.5-2.5 MCG/ACT AERS Inhale 2 puffs into the lungs daily.  .   carvedilol (COREG) 6.25 MG tablet TAKE 1 TABLET BY MOUTH TWO  TIMES DAILY WITH A MEAL  . [  pantoprazole (PROTONIX) 40 MG tablet Take 1 tablet (40 mg total) by mouth daily. Take 30-60 min before first meal of the day           Objective:     amb very hoarse wm nad with mint in mouth   Wt Readings from Last 3 Encounters:  12/18/17 173 lb (78.5 kg)  12/15/17 172 lb (78 kg)  11/16/17 168 lb (76.2 kg)     Vital signs reviewed - Note on arrival 02 sats  92% on RA       Obese   HEENT: nl dentition / oropharynx. Nl external ear canals without cough reflex -  Mild bilateral non-specific turbinate edema     NECK :  without JVD/Nodes/TM/ nl carotid upstrokes bilaterally   LUNGS: no acc muscle use,  Mild barrel  contour chest wall with bilateral  Distant bs s audible wheeze and  without cough on insp or exp maneuver and mild  Hyperresonant  to  percussion bilaterally     CV:  RRR  no s3 or murmur or increase in P2, and no edema   ABD:  Quite obese/ soft and nontender with pos late  insp Hoover's  in the supine position. No bruits or organomegaly appreciated, bowel sounds nl  MS:   Nl  gait/  ext warm without deformities, calf tenderness, cyanosis or clubbing No obvious joint restrictions   SKIN: warm and dry without lesions    NEURO:  alert, approp, nl sensorium with  no motor or cerebellar deficits apparent.       CXR PA and Lateral:   12/18/2017 :    I personally reviewed images and  impression as follows:   slt coarsened markings/ min blunting of  CP angles    Labs ordered/ reviewed:      Chemistry      Component Value Date/Time   NA 140 12/18/2017 1601   K 4.6 12/18/2017 1601   CL 104 12/18/2017 1601   CO2 29 12/18/2017 1601   BUN 26 (H) 12/18/2017 1601   CREATININE 1.43 12/18/2017 1601      Component Value Date/Time   CALCIUM 9.6 12/18/2017 1601        Lab Results  Component Value Date   WBC 9.1 12/18/2017   HGB 15.9 12/18/2017   HCT 46.6 12/18/2017   MCV 99.2 12/18/2017   PLT 250.0 12/18/2017       Eos                                                                0.1                                      12/18/2017    Lab Results  Component Value Date   TSH 1.46 12/18/2017     Lab Results  Component Value Date   PROBNP 153.0 (H) 12/18/2017       Lab Results  Component Value Date   ESRSEDRATE 8 12/18/2017         Labs ordered 12/18/2017  Allergy profile     Assessment

## 2017-12-19 ENCOUNTER — Encounter: Payer: Self-pay | Admitting: Internal Medicine

## 2017-12-19 LAB — RESPIRATORY ALLERGY PROFILE REGION II ~~LOC~~
Allergen, A. alternata, m6: <0.1 kU/L
Allergen, Cottonwood, t14: <0.1 kU/L
Allergen, D pternoyssinus,d7: <0.1 kU/L
Allergen, P. notatum, m1: <0.1 kU/L
Bermuda Grass: <0.1 kU/L
Box Elder IgE: <0.1 kU/L
CLADOSPORIUM HERBARUM (M2) IGE: <0.1 kU/L
CLASS: 0
CLASS: 0
CLASS: 0
CLASS: 0
CLASS: 0
CLASS: 0
CLASS: 0
CLASS: 0
CLASS: 0
CLASS: 0
CLASS: 0
COMMON RAGWEED (SHORT) (W1) IGE: <0.1 kU/L
Class: 0
Class: 0
Class: 0
Class: 0
Class: 0
Class: 0
Class: 0
Class: 0
Class: 0
Class: 0
Class: 0
Class: 0
Class: 0
D. farinae: <0.1 kU/L
Dog Dander: <0.1 kU/L
Elm IgE: <0.1 kU/L
IgE (Immunoglobulin E), Serum: 186 kU/L — ABNORMAL HIGH (ref <=114)
Johnson Grass: <0.1 kU/L
Sheep Sorrel IgE: <0.1 kU/L
Timothy Grass: <0.1 kU/L

## 2017-12-19 LAB — INTERPRETATION:

## 2017-12-19 LAB — D-DIMER, QUANTITATIVE: D-Dimer, Quant: 0.76 mcg/mL FEU — ABNORMAL HIGH (ref <0.50)

## 2017-12-19 NOTE — Assessment & Plan Note (Signed)
Trial off acei 11/16/2017 due to hoarseness and noct sob 11/16/2017 >>> no better 12/18/2017  - try off coreg 12/18/2017       In the setting of respiratory symptoms of unknown etiology,  It would be preferable to use bystolic, the most beta -1  selective Beta blocker available in sample form, with bisoprolol the most selective generic choice  on the market, at least on a trial basis, to make sure the spillover Beta 2 effects of the less specific Beta blockers are not contributing to this patient's symptoms.   Try d/c coreg and rx bisoprolol 5 mg daily trial basis

## 2017-12-19 NOTE — Assessment & Plan Note (Signed)
Symptoms and desats ? Etiology  not clear to what extent this is actually all a  pulmonary  problem but pt does appear to have difficult to sort out respiratory symptoms of unknown origin for which  DDX  = almost all start with A and  include Adherence, Ace Inhibitors, Acid Reflux, Active Sinus Disease, Alpha 1 Antitripsin deficiency, Anxiety masquerading as Airways dz,  ABPA,  Allergy(esp in young), Aspiration (esp in elderly), Adverse effects of meds,  Active smoking or Vaping, A bunch of PE's/clot burden (a few small clots can't cause this syndrome unless there is already severe underlying pulm or vascular dz with poor reserve),  Anemia or thyroid disorder, plus two Bs  = Bronchiectasis and Beta blocker use..and one C= CHF    Adherence is always the initial "prime suspect" and is a multilayered concern that requires a "trust but verify" approach in every patient - starting with knowing how to use medications, especially inhalers, correctly, keeping up with refills and understanding the fundamental difference between maintenance and prns vs those medications only taken for a very short course and then stopped and not refilled.  - see device teaching  - used teachback technique for med changes  ? Acid (or non-acid) GERD > always difficult to exclude as up to 75% of pts in some series report no assoc GI/ Heartburn symptoms> rec continue max (24h)  acid suppression and diet restrictions/ reviewed   > stop all mint products    ? acei effects > continue to leave off as hasn't been 6 weeks yet off   ? Allergy / asthma > try off BB next   ? Adverse effects of meds > none of the other usual suspects listed, pt's wife assures list is complete  ? A bunch of PE's > send d dimer to be complete but posterior probability is low here  Anemia/ thyroid dz ruled out today   Beta blocker effects > see hbp  ? CHF  > bnp intermediate level, has f/u with cards end of month

## 2017-12-19 NOTE — Assessment & Plan Note (Addendum)
D/c ACEi 11/16/17 and rx gerd rx though still using mints 12/18/2017  - max rx gerd 12/18/2017 / reviewed diet   - refer to Dr Delford Field at [facility] 12/18/2017 >>>    Discussed in detail all the  indications, usual  risks and alternatives  relative to the benefits with patient who agrees to proceed with w/u as outlined.

## 2017-12-19 NOTE — Assessment & Plan Note (Addendum)
-   ONO RA 12/02/27  02 sats < 89% x 44m - 12/18/2017   Walked RA  2 laps @ 185 ft each stopped due to  desats to 86% corrected on 4lpm POC so rec 4lpm POC with walking beyond confines of house     I had an extended discussion with the patient reviewing all relevant studies completed to date and  lasting 25 minutes of a 40  minute office visit addressing  severe non-specific but potentially very serious refractory respiratory symptoms of uncertain and potentially multiple  Etiologies.   See device teaching which extended face to face time for this visit   Each maintenance medication was reviewed in detail including most importantly the difference between maintenance and prns and under what circumstances the prns are to be triggered using an action plan format that is not reflected in the computer generated alphabetically organized AVS.    Please see AVS for specific instructions unique to this office visit that I personally wrote and verbalized to the the pt in detail and then reviewed with pt  by my nurse highlighting any changes in therapy/plan of care  recommended at today's visit.

## 2017-12-19 NOTE — Assessment & Plan Note (Addendum)
Spirometry 11/16/2017  FEV1 0.9 (42%)  Ratio 50 with classic curvature p stiolto x one puff prior  - 11/16/2017  After extensive coaching inhaler device,  effectiveness =    75% with smi > change stiolto to 2 pffs each am and try off acei/ on gerd rx   - ONO RA 12/02/27  02 sats < 89% x 21m -see chronic resp failure  - PFT's  12/18/2017  FEV1 1.15 (53 % ) ratio 54   p 7 % improvement from saba p stiolto prior to study with DLCO  34 % corrects to 57  % for alv volume  C/w gold II/Group B criteria  - trial off coreg 12/18/2017    - The proper method of use, as well as anticipated side effects, of a metered-dose inhaler are discussed and demonstrated to the patient.   Does not appear stiolto is helping doe which may be more related to desats ? Etiology ? All copd or not with very low dlco c/w emphysema but certainly this component is very long standing - ok to try off when all his samples of stiolto run out and just use prn saba   Will next try off coreg/ do HRCT s contrast as renal fxn down a bit and this will give [place] a look at gas exchange issues at the alveolar level and PE felt to be unlikely given chronicity.

## 2017-12-20 NOTE — Progress Notes (Signed)
Spoke with pt and notified of results per Dr. Wert. Pt verbalized understanding and denied any questions. 

## 2017-12-22 ENCOUNTER — Telehealth: Payer: Self-pay | Admitting: Internal Medicine

## 2017-12-22 DIAGNOSIS — M2550 Pain in unspecified joint: Secondary | ICD-10-CM | POA: Diagnosis not present

## 2017-12-22 DIAGNOSIS — M353 Polymyalgia rheumatica: Secondary | ICD-10-CM | POA: Diagnosis not present

## 2017-12-22 DIAGNOSIS — Z79899 Other long term (current) drug therapy: Secondary | ICD-10-CM | POA: Diagnosis not present

## 2017-12-22 NOTE — Telephone Encounter (Signed)
Notes recorded by Nyoka Cowden, MD on 12/19/2017 at 7:36 AM EDT See ov/ age and chronic illness adjusted this is not a significant elevation and does not suggest any significant clot burden   Pt's wife aware of results. I have faxed information to kidney dr and cardiologist

## 2017-12-28 ENCOUNTER — Telehealth (HOSPITAL_COMMUNITY): Payer: Self-pay | Admitting: *Deleted

## 2017-12-28 NOTE — Telephone Encounter (Signed)
Left message on voicemail per DPR in reference to upcoming appointment scheduled on 01/02/18 with detailed instructions given per Myocardial Perfusion Study Information Sheet for the test. LM to arrive 15 minutes early, and that it is imperative to arrive on time for appointment to keep from having the test rescheduled. If you need to cancel or reschedule your appointment, please call the office within 24 hours of your appointment. Failure to do so may result in a cancellation of your appointment, and a $50 no show fee. Phone number given for call back for any questions. [name] [name]    

## 2017-12-29 ENCOUNTER — Telehealth: Payer: Self-pay | Admitting: Internal Medicine

## 2017-12-29 ENCOUNTER — Encounter: Payer: Self-pay | Admitting: Internal Medicine

## 2017-12-29 DIAGNOSIS — G4733 Obstructive sleep apnea (adult) (pediatric): Secondary | ICD-10-CM

## 2017-12-29 HISTORY — DX: Obstructive sleep apnea (adult) (pediatric): G47.33

## 2017-12-29 NOTE — Telephone Encounter (Signed)
Dr Sherene Sires reviewed HST done by [facility]  Per MW- needs CPAP titration study to see if this corrects low o2  LMTCB

## 2017-12-29 NOTE — Telephone Encounter (Signed)
Routed to MW by error and will forward to my basket while waiting for pt TCB

## 2017-12-29 NOTE — Telephone Encounter (Signed)
Ok to close this encounter?

## 2018-01-02 ENCOUNTER — Ambulatory Visit (INDEPENDENT_AMBULATORY_CARE_PROVIDER_SITE_OTHER): Payer: Medicare Other

## 2018-01-02 VITALS — Ht 66.5 in | Wt 172.0 lb

## 2018-01-02 DIAGNOSIS — I10 Essential (primary) hypertension: Secondary | ICD-10-CM | POA: Diagnosis not present

## 2018-01-02 DIAGNOSIS — I2510 Atherosclerotic heart disease of native coronary artery without angina pectoris: Secondary | ICD-10-CM

## 2018-01-02 DIAGNOSIS — R0609 Other forms of dyspnea: Secondary | ICD-10-CM | POA: Diagnosis not present

## 2018-01-02 LAB — MYOCARDIAL PERFUSION IMAGING
CHL CUP NUCLEAR SDS: 2
CHL CUP NUCLEAR SSS: 2
LV dias vol: 60 mL (ref 62–150)
LV sys vol: 19 mL
NUC STRESS TID: 1.05
Peak HR: 85 {beats}/min
Rest HR: 66 {beats}/min
SRS: 0

## 2018-01-02 MED ORDER — REGADENOSON 0.4 MG/5ML IV SOLN
0.4000 mg | Freq: Once | INTRAVENOUS | Status: AC
  Administered 2018-01-02: 0.4 mg via INTRAVENOUS

## 2018-01-02 MED ORDER — TECHNETIUM TC 99M TETROFOSMIN IV KIT
10.9000 | PACK | Freq: Once | INTRAVENOUS | Status: AC | PRN
  Administered 2018-01-02: 10.9 via INTRAVENOUS

## 2018-01-02 MED ORDER — TECHNETIUM TC 99M TETROFOSMIN IV KIT
32.7000 | PACK | Freq: Once | INTRAVENOUS | Status: AC | PRN
  Administered 2018-01-02: 32.7 via INTRAVENOUS

## 2018-01-02 NOTE — Telephone Encounter (Signed)
ATC pt, no answer. Left message for pt to call back.  

## 2018-01-02 NOTE — Telephone Encounter (Signed)
LMTCB

## 2018-01-02 NOTE — Telephone Encounter (Signed)
Patient returned phone call. °

## 2018-01-03 DIAGNOSIS — R0602 Shortness of breath: Secondary | ICD-10-CM | POA: Diagnosis not present

## 2018-01-03 DIAGNOSIS — R069 Unspecified abnormalities of breathing: Secondary | ICD-10-CM | POA: Diagnosis not present

## 2018-01-03 DIAGNOSIS — R490 Dysphonia: Secondary | ICD-10-CM | POA: Diagnosis not present

## 2018-01-03 NOTE — Telephone Encounter (Signed)
LMTCB

## 2018-01-04 NOTE — Telephone Encounter (Signed)
Ok/documented 

## 2018-01-04 NOTE — Telephone Encounter (Signed)
Pt is returning call. Cb is [phone].

## 2018-01-04 NOTE — Telephone Encounter (Signed)
Spoke with patient's wife Tobi Bastos. She stated that the patient was seen by Dr. Delford Field at [facility] and they have solved the problem. Patient is refusing to use a cpap machine at this time, thus refusing the cpap titration. Tobi Bastos stated that they thank Dr. Sherene Sires for his services but they do not see a need to come back for a visit or have further testing.   Will route this note to Dr. Sherene Sires.

## 2018-01-29 DIAGNOSIS — Z6826 Body mass index (BMI) 26.0-26.9, adult: Secondary | ICD-10-CM | POA: Diagnosis not present

## 2018-01-29 DIAGNOSIS — W19XXXA Unspecified fall, initial encounter: Secondary | ICD-10-CM | POA: Diagnosis not present

## 2018-01-29 DIAGNOSIS — R4182 Altered mental status, unspecified: Secondary | ICD-10-CM | POA: Diagnosis not present

## 2018-01-29 DIAGNOSIS — G319 Degenerative disease of nervous system, unspecified: Secondary | ICD-10-CM | POA: Diagnosis not present

## 2018-01-29 DIAGNOSIS — S301XXA Contusion of abdominal wall, initial encounter: Secondary | ICD-10-CM | POA: Diagnosis not present

## 2018-01-29 DIAGNOSIS — I6782 Cerebral ischemia: Secondary | ICD-10-CM | POA: Diagnosis not present

## 2018-01-29 DIAGNOSIS — S0990XA Unspecified injury of head, initial encounter: Secondary | ICD-10-CM | POA: Diagnosis not present

## 2018-02-05 ENCOUNTER — Telehealth: Payer: Self-pay

## 2018-02-05 ENCOUNTER — Ambulatory Visit (INDEPENDENT_AMBULATORY_CARE_PROVIDER_SITE_OTHER): Payer: Medicare Other

## 2018-02-05 DIAGNOSIS — R0609 Other forms of dyspnea: Secondary | ICD-10-CM | POA: Diagnosis not present

## 2018-02-05 DIAGNOSIS — I2510 Atherosclerotic heart disease of native coronary artery without angina pectoris: Secondary | ICD-10-CM | POA: Diagnosis not present

## 2018-02-05 DIAGNOSIS — I10 Essential (primary) hypertension: Secondary | ICD-10-CM | POA: Diagnosis not present

## 2018-02-05 NOTE — Telephone Encounter (Signed)
Patients wife called with some concerns because  PCP changed lisinopril to telmisartan 80 mg daily and changed carvedilol to bisoprolol 5 mg once a day and since change in therapy his blood pressure went up 30 points and feet are swelling and he is feeling confused please advise.

## 2018-02-05 NOTE — Progress Notes (Signed)
Complete echocardiogram has been performed.  Jimmy Huxley Vanwagoner, RDCS, RVT 

## 2018-02-05 NOTE — Telephone Encounter (Signed)
-----   Message from Garwin Brothersajan R Revankar, MD sent at 02/05/2018  2:51 PM EST ----- The results of the study is unremarkable. Please inform patient. I will discuss in detail at next appointment. Cc  primary care/referring physician Garwin Brothersajan R Revankar, MD 02/05/2018 2:51 PM

## 2018-02-05 NOTE — Telephone Encounter (Signed)
Called patient and left detailed voice message on patients phone regarding results. 

## 2018-02-06 NOTE — Telephone Encounter (Signed)
Pl call pt. It is best that pcp makes modifications else there will be confusion. I will glad to handle this if pcp wants me to take over

## 2018-02-06 NOTE — Telephone Encounter (Signed)
Patient trusts your judgment and would like for you to manage. Previously this was being managed by you but at his most recent PCP visit (new doctor due to previous PCP retiring) meds were changed. Patient is unable to come to [facility] due to being confused, soonest available in [place] is the 12th, appointment has been set for then. Please advise for the interim.

## 2018-02-06 NOTE — Telephone Encounter (Signed)
Wife has requested that the patient be seen tomorrow as the patient would like to discuss the issue with Dr. Tomie Chinaevankar. Appointment for 10:00 am has been set.

## 2018-02-07 ENCOUNTER — Encounter: Payer: Self-pay | Admitting: Cardiology

## 2018-02-07 ENCOUNTER — Ambulatory Visit (INDEPENDENT_AMBULATORY_CARE_PROVIDER_SITE_OTHER): Payer: Medicare Other | Admitting: Cardiology

## 2018-02-07 VITALS — BP 152/82 | HR 72 | Ht 66.5 in | Wt 177.0 lb

## 2018-02-07 DIAGNOSIS — I2510 Atherosclerotic heart disease of native coronary artery without angina pectoris: Secondary | ICD-10-CM | POA: Diagnosis not present

## 2018-02-07 DIAGNOSIS — E088 Diabetes mellitus due to underlying condition with unspecified complications: Secondary | ICD-10-CM | POA: Diagnosis not present

## 2018-02-07 DIAGNOSIS — I10 Essential (primary) hypertension: Secondary | ICD-10-CM | POA: Diagnosis not present

## 2018-02-07 DIAGNOSIS — E785 Hyperlipidemia, unspecified: Secondary | ICD-10-CM

## 2018-02-07 MED ORDER — CARVEDILOL 6.25 MG PO TABS: 6.2500 mg | ORAL_TABLET | Freq: Two times a day (BID) | ORAL | 0 refills | Status: DC

## 2018-02-07 MED ORDER — LISINOPRIL 20 MG PO TABS: 20.0000 mg | ORAL_TABLET | Freq: Two times a day (BID) | ORAL | 0 refills | Status: DC

## 2018-02-07 NOTE — Patient Instructions (Signed)
Medication Instructions:  Your physician has recommended you make the following change in your medication:   STOP bisoprolol STOP telmisartan START lisinopril 20 mg twice daily START carvedilol 6.25 mg twice daily  If you need a refill on your cardiac medications before your next appointment, please call your pharmacy.   Lab work: None  If you have labs (blood work) drawn today and your tests are completely normal, you will receive your results only by: Marland Kitchen. MyChart Message (if you have MyChart) OR . A paper copy in the mail If you have any lab test that is abnormal or we need to change your treatment, we will call you to review the results.  Testing/Procedures: None  Follow-Up: At [facility], you and your health needs are our priority.  As part of our continuing mission to provide you with exceptional heart care, we have created designated Provider Care Teams.  These Care Teams include your primary Cardiologist (physician) and Advanced Practice Providers (APPs -  Physician Assistants and Nurse Practitioners) who all work together to provide you with the care you need, when you need it.  You will need a follow up appointment that is already scheduled.  Please call our office 2 months in advance to schedule this appointment.  You may see another member of our [facility] Provider Team in [place]: Gypsy Balsamobert Krasowski, MD . Norman HerrlichBrian Munley, MD  Any Other Special Instructions Will Be Listed Below (If Applicable).   Blood Pressure Record Sheet Your blood pressure on this visit to the emergency department or clinic is elevated. This does not necessarily mean you have high blood pressure (hypertension), but it does mean that your blood pressure needs to be rechecked. Many times your blood pressure can increase due to illness, pain, anxiety, or other factors. We recommend that you get a series of blood pressure readings done over a period of 5 days. It is best to get a reading in the morning  and one in the evening. You should make sure to sit and relax for 1-5 minutes before the reading is taken. Write the readings down and make a follow-up appointment with your health care provider to discuss the results. If there is not a free clinic or a drug store with a blood-pressure-taking machine near you, you can purchase blood-pressure-taking equipment from a drug store. Having one in the home allows you the convenience of taking your blood pressure while you are home and relaxed. Blood Pressure Log Date: _______________________  a.m. _____________________  p.m. _____________________  Date: _______________________  a.m. _____________________  p.m. _____________________  Date: _______________________  a.m. _____________________  p.m. _____________________  Date: _______________________  a.m. _____________________  p.m. _____________________  Date: _______________________  a.m. _____________________  p.m. _____________________  This information is not intended to replace advice given to you by your health care provider. Make sure you discuss any questions you have with your health care provider. Document Released: 11/20/2002 Document Revised: 02/05/2016 Document Reviewed: 04/16/2013 Elsevier Interactive Patient Education  2018 [employer]

## 2018-02-07 NOTE — Progress Notes (Signed)
Cardiology Office Note:    Date:  02/07/2018   ID:  [name], DOB 11-05-1930, MRN [id number]  PCP:  Marlyn Corporal, PA  Cardiologist:  Garwin Brothers, MD   Referring MD: Marlyn Corporal, PA    ASSESSMENT:    1. Coronary artery disease involving native coronary artery of native heart without angina pectoris   2. Essential hypertension   3. Dyslipidemia   4. Diabetes mellitus due to underlying condition with unspecified complications (HCC)    PLAN:    In order of problems listed above:  1. Secondary prevention stressed with the patient.  Importance of compliance with diet and medication stressed and he vocalized understanding.  Blood pressure stable.  Diet was discussed for dyslipidemia and diabetes mellitus. 2. In view of his concerns I discussed with the patient that he could stop his bisoprolol and telmisartan.  I have initiated him on carvedilol and lisinopril at the same doses as he was taking before.  He will be back in a week or so he already has an appointment for the 12th at our [place] office for follow-up.  At that time obviously his blood pressure will be checked and also we will do a Chem-7. 3. He knows to go to the nearest emergency room for any concerning symptoms.  I also advised him to talk to his primary care physician his memory issues.   Medication Adjustments/Labs and Tests Ordered: Current medicines are reviewed at length with the patient today.  Concerns regarding medicines are outlined above.  No orders of the defined types were placed in this encounter.  No orders of the defined types were placed in this encounter.    No chief complaint on file.    History of Present Illness:    Christopher Kline is a 84 y.o. male patient has past medical history of coronary artery disease essential hypertension dyslipidemia and diabetes mellitus.  He mentions to me that he has been having issues with memory ever since his medications were changed to bisoprolol and  telmisartan.  He denies any chest pain orthopnea or PND.  His wife is very supportive and accompanies him for this visit.  At the time of my evaluation, the patient is alert awake oriented and in no distress.  Past Medical History:  Diagnosis Date  . CAD (coronary artery disease)   . Chronic kidney disease   . COPD (chronic obstructive pulmonary disease) (HCC)   . Glaucoma   . Hyperlipidemia   . Hypertension   . SOB (shortness of breath)     Past Surgical History:  Procedure Laterality Date  . CORONARY ARTERY BYPASS GRAFT      Current Medications: Current Meds  Medication Sig  . aspirin EC 81 MG tablet Take 81 mg by mouth daily.  Marland Kitchen atorvastatin (LIPITOR) 80 MG tablet Take 0.5 tablets (40 mg total) by mouth daily at 6 PM.  . bisoprolol (ZEBETA) 5 MG tablet Take 1 tablet (5 mg total) by mouth daily.  . dorzolamide (TRUSOPT) 2 % ophthalmic solution Place 1 drop into both eyes 2 (two) times daily.  . famotidine (PEPCID) 20 MG tablet One at bedtime (Patient taking differently: Take 20 mg by mouth 2 (two) times daily. One at bedtime )  . latanoprost (XALATAN) 0.005 % ophthalmic solution Place 1 drop into both eyes daily.  . Lutein 40 MG CAPS Take 1 capsule by mouth daily.  . montelukast (SINGULAIR) 10 MG tablet Take 10 mg by mouth daily.  Marland Kitchen  Multiple Vitamin (MULTIVITAMIN) tablet Take 1 tablet by mouth daily.  . pantoprazole (PROTONIX) 40 MG tablet Take 30- 60 min before your first and last meals of the day  . predniSONE (DELTASONE) 5 MG tablet Take 6 mg by mouth daily with breakfast. Per Dr. Herma CarsonZ  One 5 mg and 1 mg daily  . PROAIR HFA 108 (90 Base) MCG/ACT inhaler Inhale 2 puffs into the lungs every 4 (four) hours as needed.   Marland Kitchen. telmisartan (MICARDIS) 80 MG tablet Take 1 tablet (80 mg total) by mouth daily.  . Tiotropium Bromide-Olodaterol (STIOLTO RESPIMAT) 2.5-2.5 MCG/ACT AERS Inhale 2 puffs into the lungs daily.  . vitamin B-12 (CYANOCOBALAMIN) 1000 MCG tablet Take 1,000 mcg by mouth  daily.     Allergies:   Vancomycin   Social History   Socioeconomic History  . Marital status: Married    Spouse name: Not on file  . Number of children: Not on file  . Years of education: Not on file  . Highest education level: Not on file  Occupational History  . Not on file  Social Needs  . Financial resource strain: Not on file  . Food insecurity:    Worry: Not on file    Inability: Not on file  . Transportation needs:    Medical: Not on file    Non-medical: Not on file  Tobacco Use  . Smoking status: Former Smoker    Packs/day: 1.00    Years: 65.00    Pack years: 65.00    Types: Cigarettes, Pipe    Last attempt to quit: 03/07/2006    Years since quitting: 11.9  . Smokeless tobacco: Never Used  Substance and Sexual Activity  . Alcohol use: No  . Drug use: No  . Sexual activity: Not on file  Lifestyle  . Physical activity:    Days per week: Not on file    Minutes per session: Not on file  . Stress: Not on file  Relationships  . Social connections:    Talks on phone: Not on file    Gets together: Not on file    Attends religious service: Not on file    Active member of club or organization: Not on file    Attends meetings of clubs or organizations: Not on file    Relationship status: Not on file  Other Topics Concern  . Not on file  Social History Narrative  . Not on file     Family History: The patient's family history is not on file.  ROS:   Please see the history of present illness.    All other systems reviewed and are negative.  EKGs/Labs/Other Studies Reviewed:    The following studies were reviewed today: I discussed my findings with the patient at extensive length.  This    Recent Labs: 12/18/2017: BUN 26; Creatinine, Ser 1.43; Hemoglobin 15.9; Platelets 250.0; Potassium 4.6; Pro B Natriuretic peptide (BNP) 153.0; Sodium 140; TSH 1.46  Recent Lipid Panel No results found for: CHOL, TRIG, HDL, CHOLHDL, VLDL, LDLCALC, LDLDIRECT  Physical  Exam:    VS:  BP (!) 152/82 (BP Location: Right Arm, Patient Position: Sitting, Cuff Size: Normal)   Pulse 72   Ht 5' 6.5" (1.689 m)   Wt 177 lb (80.3 kg)   SpO2 93%   BMI 28.14 kg/m     Wt Readings from Last 3 Encounters:  02/07/18 177 lb (80.3 kg)  01/02/18 172 lb (78 kg)  12/18/17 173 lb (78.5 kg)  GEN: Patient is in no acute distress HEENT: Normal NECK: No JVD; No carotid bruits LYMPHATICS: No lymphadenopathy CARDIAC: Hear sounds regular, 2/6 systolic murmur at the apex. RESPIRATORY:  Clear to auscultation without rales, wheezing or rhonchi  ABDOMEN: Soft, non-tender, non-distended MUSCULOSKELETAL:  No edema; No deformity  SKIN: Warm and dry NEUROLOGIC:  Alert and oriented x 3 PSYCHIATRIC:  Normal affect   Signed, Garwin Brothers, MD  02/07/2018 10:29 AM    [facility]

## 2018-02-15 ENCOUNTER — Ambulatory Visit (INDEPENDENT_AMBULATORY_CARE_PROVIDER_SITE_OTHER): Payer: Medicare Other | Admitting: Cardiology

## 2018-02-15 ENCOUNTER — Encounter: Payer: Self-pay | Admitting: Cardiology

## 2018-02-15 VITALS — BP 128/70 | HR 83 | Ht 66.5 in | Wt 174.0 lb

## 2018-02-15 DIAGNOSIS — E088 Diabetes mellitus due to underlying condition with unspecified complications: Secondary | ICD-10-CM

## 2018-02-15 DIAGNOSIS — I2510 Atherosclerotic heart disease of native coronary artery without angina pectoris: Secondary | ICD-10-CM | POA: Diagnosis not present

## 2018-02-15 DIAGNOSIS — I10 Essential (primary) hypertension: Secondary | ICD-10-CM

## 2018-02-15 DIAGNOSIS — E785 Hyperlipidemia, unspecified: Secondary | ICD-10-CM | POA: Diagnosis not present

## 2018-02-15 NOTE — Patient Instructions (Signed)
Medication Instructions:  Your physician recommends that you continue on your current medications as directed. Please refer to the Current Medication list given to you today.  If you need a refill on your cardiac medications before your next appointment, please call your pharmacy.   Lab work: Your physician recommends that you have the following labs drawn: BMP to be done today.  If you have labs (blood work) drawn today and your tests are completely normal, you will receive your results only by: Marland Kitchen. MyChart Message (if you have MyChart) OR . A paper copy in the mail If you have any lab test that is abnormal or we need to change your treatment, we will call you to review the results.  Testing/Procedures: None  Follow-Up: At [facility], you and your health needs are our priority.  As part of our continuing mission to provide you with exceptional heart care, we have created designated Provider Care Teams.  These Care Teams include your primary Cardiologist (physician) and Advanced Practice Providers (APPs -  Physician Assistants and Nurse Practitioners) who all work together to provide you with the care you need, when you need it.  You will need a follow up appointment in 3 months.  Please call our office 2 months in advance to schedule this appointment.  You may see another member of our [facility] Provider Team in [place]: Gypsy Balsamobert Krasowski, MD . Norman HerrlichBrian Munley, MD  Any Other Special Instructions Will Be Listed Below (If Applicable).

## 2018-02-15 NOTE — Progress Notes (Signed)
Cardiology Office Note:    Date:  02/15/2018   ID:  Christopher LeanJames D Weaber, DOB 13-Aug-1930, MRN [id number]  PCP:  Marlyn CorporalYates, Kate H, PA  Cardiologist:  Garwin Brothersajan R Cristian Grieves, MD   Referring MD: Marlyn CorporalYates, Kate H, PA    ASSESSMENT:    1. Coronary artery disease involving native coronary artery of native heart without angina pectoris   2. Essential hypertension   3. Dyslipidemia   4. Diabetes mellitus due to underlying condition with unspecified complications (HCC)    PLAN:    In order of problems listed above:  1. Secondary prevention stressed with patient.  Importance of compliance with diet and medication stressed and she vocalized understanding.  His blood pressure is stable.  Diet was discussed for dyslipidemia diabetes mellitus.  I offered to initiate on diuretic therapy but they are not keen on it at this time and I respect her wishes. 2. Because of these medication changes we will obtain a Chem-7 today. 3. Patient will be seen in follow-up appointment in 3 months or earlier if the patient has any concerns    Medication Adjustments/Labs and Tests Ordered: Current medicines are reviewed at length with the patient today.  Concerns regarding medicines are outlined above.  No orders of the defined types were placed in this encounter.  No orders of the defined types were placed in this encounter.    No chief complaint on file.    History of Present Illness:    Christopher Kline is a 84 y.o. male.  Patient has past medical history of coronary artery disease.  His medications were changed and he was feeling terribly bad and his wife mentions to me that it was a different person and had significant issues with memory after the medication changes were made.  So for sake of simplicity when I saw him last time I discontinued those medications and put him back on his previous medications.  His wife mentions to me that he has significantly improved and is now as good as he was in the past.  She is very happy  about it.  No chest pain orthopnea or PND.  He has mild bilateral pedal edema according to the wife.  Past Medical History:  Diagnosis Date  . CAD (coronary artery disease)   . Chronic kidney disease   . COPD (chronic obstructive pulmonary disease) (HCC)   . Glaucoma   . Hyperlipidemia   . Hypertension   . SOB (shortness of breath)     Past Surgical History:  Procedure Laterality Date  . CORONARY ARTERY BYPASS GRAFT      Current Medications: Current Meds  Medication Sig  . aspirin EC 81 MG tablet Take 81 mg by mouth daily.  Marland Kitchen. atorvastatin (LIPITOR) 80 MG tablet Take 0.5 tablets (40 mg total) by mouth daily at 6 PM.  . carvedilol (COREG) 6.25 MG tablet Take 1 tablet (6.25 mg total) by mouth 2 (two) times daily.  . dorzolamide (TRUSOPT) 2 % ophthalmic solution Place 1 drop into both eyes 2 (two) times daily.  Marland Kitchen. latanoprost (XALATAN) 0.005 % ophthalmic solution Place 1 drop into both eyes daily.  Marland Kitchen. lisinopril (PRINIVIL,ZESTRIL) 20 MG tablet Take 1 tablet (20 mg total) by mouth 2 (two) times daily.  . Lutein 40 MG CAPS Take 1 capsule by mouth daily.  . Multiple Vitamin (MULTIVITAMIN) tablet Take 1 tablet by mouth daily.  . predniSONE (DELTASONE) 5 MG tablet Take 6 mg by mouth daily with breakfast. Per Dr. ZHerma Carson  One 5 mg and 1 mg daily  . PROAIR HFA 108 (90 Base) MCG/ACT inhaler Inhale 2 puffs into the lungs every 4 (four) hours as needed.   . Tiotropium Bromide-Olodaterol (STIOLTO RESPIMAT) 2.5-2.5 MCG/ACT AERS Inhale 2 puffs into the lungs daily.  . vitamin B-12 (CYANOCOBALAMIN) 1000 MCG tablet Take 1,000 mcg by mouth daily.     Allergies:   Vancomycin   Social History   Socioeconomic History  . Marital status: Married    Spouse name: Not on file  . Number of children: Not on file  . Years of education: Not on file  . Highest education level: Not on file  Occupational History  . Not on file  Social Needs  . Financial resource strain: Not on file  . Food insecurity:     Worry: Not on file    Inability: Not on file  . Transportation needs:    Medical: Not on file    Non-medical: Not on file  Tobacco Use  . Smoking status: Former Smoker    Packs/day: 1.00    Years: 65.00    Pack years: 65.00    Types: Cigarettes, Pipe    Last attempt to quit: 03/07/2006    Years since quitting: 11.9  . Smokeless tobacco: Never Used  Substance and Sexual Activity  . Alcohol use: No  . Drug use: No  . Sexual activity: Not on file  Lifestyle  . Physical activity:    Days per week: Not on file    Minutes per session: Not on file  . Stress: Not on file  Relationships  . Social connections:    Talks on phone: Not on file    Gets together: Not on file    Attends religious service: Not on file    Active member of club or organization: Not on file    Attends meetings of clubs or organizations: Not on file    Relationship status: Not on file  Other Topics Concern  . Not on file  Social History Narrative  . Not on file     Family History: The patient's family history is not on file.  ROS:   Please see the history of present illness.    All other systems reviewed and are negative.  EKGs/Labs/Other Studies Reviewed:    The following studies were reviewed today: I discussed my findings with the patient at extensive length.   Recent Labs: 12/18/2017: BUN 26; Creatinine, Ser 1.43; Hemoglobin 15.9; Platelets 250.0; Potassium 4.6; Pro B Natriuretic peptide (BNP) 153.0; Sodium 140; TSH 1.46  Recent Lipid Panel No results found for: CHOL, TRIG, HDL, CHOLHDL, VLDL, LDLCALC, LDLDIRECT  Physical Exam:    VS:  BP 128/70 (BP Location: Right Arm, Patient Position: Sitting, Cuff Size: Normal)   Pulse 83   Ht 5' 6.5" (1.689 m)   Wt 174 lb (78.9 kg)   SpO2 90%   BMI 27.66 kg/m     Wt Readings from Last 3 Encounters:  02/15/18 174 lb (78.9 kg)  02/07/18 177 lb (80.3 kg)  01/02/18 172 lb (78 kg)     GEN: Patient is in no acute distress HEENT: Normal NECK: No  JVD; No carotid bruits LYMPHATICS: No lymphadenopathy CARDIAC: Hear sounds regular, 2/6 systolic murmur at the apex. RESPIRATORY:  Clear to auscultation without rales, wheezing or rhonchi  ABDOMEN: Soft, non-tender, non-distended MUSCULOSKELETAL:  No edema; No deformity  SKIN: Warm and dry NEUROLOGIC:  Alert and oriented x 3 PSYCHIATRIC:  Normal affect  Signed, Garwin Brothers, MD  02/15/2018 3:19 PM    [facility]

## 2018-02-16 LAB — BASIC METABOLIC PANEL
BUN/Creatinine Ratio: 22 (ref 10–24)
BUN: 28 mg/dL — ABNORMAL HIGH (ref 8–27)
CO2: 23 mmol/L (ref 20–29)
CREATININE: 1.3 mg/dL — AB (ref 0.76–1.27)
Calcium: 9.2 mg/dL (ref 8.6–10.2)
Chloride: 102 mmol/L (ref 96–106)
GFR, EST AFRICAN AMERICAN: 58 mL/min/{1.73_m2} — AB (ref >59)
GFR, EST NON AFRICAN AMERICAN: 50 mL/min/{1.73_m2} — AB (ref >59)
Glucose: 223 mg/dL — ABNORMAL HIGH (ref 65–99)
POTASSIUM: 4.7 mmol/L (ref 3.5–5.2)
Sodium: 139 mmol/L (ref 134–144)

## 2018-02-27 ENCOUNTER — Telehealth: Payer: Self-pay | Admitting: Cardiology

## 2018-02-27 MED ORDER — TELMISARTAN 20 MG PO TABS: 20.0000 mg | ORAL_TABLET | Freq: Every day | ORAL | 0 refills | Status: DC

## 2018-02-27 NOTE — Addendum Note (Signed)
Addended by: Crist FatLOCKHART, Cinde Ebert P on: 02/27/2018 12:19 PM   Modules accepted: Orders

## 2018-02-27 NOTE — Telephone Encounter (Signed)
Patient's wife, Kennon RoundsSally, reports that the patient is adamant about stopping lisinopril and switching to another medication. Patient has been having diarrhea and he feels that the lisinopril is causing it. Dr. Tomie Chinaevankar advised to stop lisinopril and replace it with telmisartan 20 mg daily. [facility] prescription has been [facility] to [facility] [place]

## 2018-02-27 NOTE — Telephone Encounter (Signed)
Left message for patient's wife, Kennon RoundsSally, per DPR to return call.

## 2018-02-27 NOTE — Telephone Encounter (Signed)
Christopher Kline Christopher Kline that if the diarrhea persists to reach out to patient's PCP. Christopher RoundsSally verbalized understanding. No further questions.

## 2018-02-27 NOTE — Telephone Encounter (Signed)
Patient is having side effects from the Lisinopril and having diarrhea and patient wants to take another medication other the Lisinopril.  Wife came in and wants to see if we can call in some thing else [facility] [place]... maybe enough to try something.. I informed patient that Dr [facility]ar [facility] and that it may be Thursday before we are able to.. She ok'd.. and understood.

## 2018-03-26 ENCOUNTER — Telehealth: Payer: Self-pay | Admitting: Cardiology

## 2018-03-26 ENCOUNTER — Other Ambulatory Visit: Payer: Self-pay

## 2018-03-26 MED ORDER — TELMISARTAN 20 MG PO TABS: 20.0000 mg | ORAL_TABLET | Freq: Every day | ORAL | 2 refills | Status: DC

## 2018-03-26 NOTE — Telephone Encounter (Signed)
C *STAT* If patient is at the pharmacy, call can be transferred to refill team.   1. Which medications need to be refilled? (please list name of each medication and dose if known) Telmesartin takes 1 daily 20mg    2. Which pharmacy/location (including street and city if local pharmacy) is medication to be sent to?[facility]  3. Do they need a 30 day or 90 day supply? 90

## 2018-03-26 NOTE — Telephone Encounter (Signed)
Medication sent to pharmacy  

## 2018-03-30 DIAGNOSIS — Z881 Allergy status to other antibiotic agents status: Secondary | ICD-10-CM | POA: Diagnosis not present

## 2018-03-30 DIAGNOSIS — Z9841 Cataract extraction status, right eye: Secondary | ICD-10-CM | POA: Diagnosis not present

## 2018-03-30 DIAGNOSIS — H401134 Primary open-angle glaucoma, bilateral, indeterminate stage: Secondary | ICD-10-CM | POA: Diagnosis not present

## 2018-03-30 DIAGNOSIS — Z961 Presence of intraocular lens: Secondary | ICD-10-CM | POA: Diagnosis not present

## 2018-03-30 DIAGNOSIS — H2512 Age-related nuclear cataract, left eye: Secondary | ICD-10-CM | POA: Diagnosis not present

## 2018-03-30 DIAGNOSIS — H401132 Primary open-angle glaucoma, bilateral, moderate stage: Secondary | ICD-10-CM | POA: Diagnosis not present

## 2018-04-02 DIAGNOSIS — H401193 Primary open-angle glaucoma, unspecified eye, severe stage: Secondary | ICD-10-CM | POA: Diagnosis not present

## 2018-04-16 DIAGNOSIS — I499 Cardiac arrhythmia, unspecified: Secondary | ICD-10-CM | POA: Diagnosis not present

## 2018-04-16 DIAGNOSIS — M353 Polymyalgia rheumatica: Secondary | ICD-10-CM | POA: Diagnosis not present

## 2018-04-16 DIAGNOSIS — I1310 Hypertensive heart and chronic kidney disease without heart failure, with stage 1 through stage 4 chronic kidney disease, or unspecified chronic kidney disease: Secondary | ICD-10-CM | POA: Diagnosis not present

## 2018-04-16 DIAGNOSIS — R7303 Prediabetes: Secondary | ICD-10-CM | POA: Diagnosis not present

## 2018-04-16 DIAGNOSIS — J449 Chronic obstructive pulmonary disease, unspecified: Secondary | ICD-10-CM | POA: Diagnosis not present

## 2018-04-16 DIAGNOSIS — E785 Hyperlipidemia, unspecified: Secondary | ICD-10-CM | POA: Diagnosis not present

## 2018-04-23 DIAGNOSIS — H2512 Age-related nuclear cataract, left eye: Secondary | ICD-10-CM | POA: Diagnosis not present

## 2018-04-23 DIAGNOSIS — H401193 Primary open-angle glaucoma, unspecified eye, severe stage: Secondary | ICD-10-CM | POA: Diagnosis not present

## 2018-04-24 ENCOUNTER — Other Ambulatory Visit: Payer: Self-pay | Admitting: *Deleted

## 2018-04-24 ENCOUNTER — Encounter (INDEPENDENT_AMBULATORY_CARE_PROVIDER_SITE_OTHER): Payer: Medicare Other

## 2018-04-24 DIAGNOSIS — I499 Cardiac arrhythmia, unspecified: Secondary | ICD-10-CM

## 2018-05-05 ENCOUNTER — Other Ambulatory Visit: Payer: Self-pay | Admitting: Cardiology

## 2018-05-18 ENCOUNTER — Encounter: Payer: Self-pay | Admitting: Cardiology

## 2018-05-18 ENCOUNTER — Ambulatory Visit (INDEPENDENT_AMBULATORY_CARE_PROVIDER_SITE_OTHER): Payer: Medicare Other | Admitting: Cardiology

## 2018-05-18 ENCOUNTER — Other Ambulatory Visit: Payer: Self-pay

## 2018-05-18 VITALS — BP 180/76 | HR 80 | Ht 66.5 in | Wt 162.0 lb

## 2018-05-18 DIAGNOSIS — I2510 Atherosclerotic heart disease of native coronary artery without angina pectoris: Secondary | ICD-10-CM

## 2018-05-18 DIAGNOSIS — J449 Chronic obstructive pulmonary disease, unspecified: Secondary | ICD-10-CM | POA: Diagnosis not present

## 2018-05-18 DIAGNOSIS — I10 Essential (primary) hypertension: Secondary | ICD-10-CM | POA: Diagnosis not present

## 2018-05-18 DIAGNOSIS — E088 Diabetes mellitus due to underlying condition with unspecified complications: Secondary | ICD-10-CM

## 2018-05-18 DIAGNOSIS — E785 Hyperlipidemia, unspecified: Secondary | ICD-10-CM

## 2018-05-18 NOTE — Patient Instructions (Signed)
Medication Instructions:  Your physician recommends that you continue on your current medications as directed. Please refer to the Current Medication list given to you today.  If you need a refill on your cardiac medications before your next appointment, please call your pharmacy.   Lab work: None If you have labs (blood work) drawn today and your tests are completely normal, you will receive your results only by: . MyChart Message (if you have MyChart) OR . A paper copy in the mail If you have any lab test that is abnormal or we need to change your treatment, we will call you to review the results.  Testing/Procedures: None  Follow-Up: At [facility], you and your health needs are our priority.  As part of our continuing mission to provide you with exceptional heart care, we have created designated Provider Care Teams.  These Care Teams include your primary Cardiologist (physician) and Advanced Practice Providers (APPs -  Physician Assistants and Nurse Practitioners) who all work together to provide you with the care you need, when you need it. You will need a follow up appointment in 6 months.  Please call our office 2 months in advance to schedule this appointment.  You may see No primary care provider on file. or another member of our [facility] Provider Team in [place]: Robert Krasowski, MD . Brian Munley, MD  Any Other Special Instructions Will Be Listed Below (If Applicable).    

## 2018-05-18 NOTE — Progress Notes (Signed)
Cardiology Office Note:    Date:  05/18/2018   ID:  Christopher Kline, DOB [birth year], MRN [id number]  PCP:  Christopher Corporal, PA  Cardiologist:  Garwin Brothers, MD   Referring MD: Christopher Corporal, PA    ASSESSMENT:    1. Coronary artery disease involving native coronary artery of native heart without angina pectoris   2. Essential hypertension   3. Diabetes mellitus due to underlying condition with unspecified complications (HCC)   4. Dyslipidemia   5. COPD GOLD II with disproportionate reduction in DLCO    PLAN:    In order of problems listed above:  1. Secondary prevention stressed with the patient.  Importance of compliance with diet and medication stressed and she vocalized understanding.  Her blood pressure is stable.  Diet was discussed dyslipidemia and diabetes mellitus and these issues are followed by her primary care physician.  His blood pressure is elevated and he has whitecoat hypertension.  His wife mentions to me that his blood pressures are fine at home and she keeps a meticulous track of these. 2. Patient will be seen in follow-up appointment in 6 months or earlier if the patient has any concerns    Medication Adjustments/Labs and Tests Ordered: Current medicines are reviewed at length with the patient today.  Concerns regarding medicines are outlined above.  No orders of the defined types were placed in this encounter.  No orders of the defined types were placed in this encounter.    No chief complaint on file.    History of Present Illness:    Christopher Kline is a 85 y.o. male.  Patient has known coronary artery disease and he denies any problems at this time and takes care of activities of daily living.  He is on home oxygen all the time because of COPD.  He has sleep apnea but refuses to use any therapy for this issue.  No chest pain orthopnea or PND.  His wife is very supportive and brings him for this visit to me.  At the time of my evaluation, the patient is  alert awake oriented and in no distress.  His wife mentions to me that he has issues with dementia and is getting forgetful.  Past Medical History:  Diagnosis Date  . CAD (coronary artery disease)   . Chronic kidney disease   . COPD (chronic obstructive pulmonary disease) (HCC)   . Glaucoma   . Hyperlipidemia   . Hypertension   . SOB (shortness of breath)     Past Surgical History:  Procedure Laterality Date  . CORONARY ARTERY BYPASS GRAFT      Current Medications: Current Meds  Medication Sig  . aspirin EC 81 MG tablet Take 81 mg by mouth daily.  Marland Kitchen atorvastatin (LIPITOR) 80 MG tablet Take 0.5 tablets (40 mg total) by mouth daily at 6 PM.  . carvedilol (COREG) 6.25 MG tablet TAKE 1 TABLET BY MOUTH TWO  TIMES DAILY  . dorzolamide (TRUSOPT) 2 % ophthalmic solution Place 1 drop into both eyes 2 (two) times daily.  Marland Kitchen latanoprost (XALATAN) 0.005 % ophthalmic solution Place 1 drop into both eyes daily.  . Lutein 40 MG CAPS Take 1 capsule by mouth daily.  . Multiple Vitamin (MULTIVITAMIN) tablet Take 1 tablet by mouth daily.  . predniSONE (DELTASONE) 5 MG tablet Take 6 mg by mouth daily with breakfast. Per Dr. Herma Carson  One 5 mg and 1 mg daily  . PROAIR HFA 108 (90 Base) MCG/ACT  inhaler Inhale 2 puffs into the lungs every 4 (four) hours as needed.   Marland Kitchen telmisartan (MICARDIS) 20 MG tablet Take 1 tablet (20 mg total) by mouth daily.  . Tiotropium Bromide-Olodaterol (STIOLTO RESPIMAT) 2.5-2.5 MCG/ACT AERS Inhale 2 puffs into the lungs daily.  . vitamin B-12 (CYANOCOBALAMIN) 1000 MCG tablet Take 1,000 mcg by mouth daily.     Allergies:   Vancomycin   Social History   Socioeconomic History  . Marital status: Married    Spouse name: Not on file  . Number of children: Not on file  . Years of education: Not on file  . Highest education level: Not on file  Occupational History  . Not on file  Social Needs  . Financial resource strain: Not on file  . Food insecurity:    Worry: Not on file     Inability: Not on file  . Transportation needs:    Medical: Not on file    Non-medical: Not on file  Tobacco Use  . Smoking status: Former Smoker    Packs/day: 1.00    Years: 65.00    Pack years: 65.00    Types: Cigarettes, Pipe    Last attempt to quit: 03/07/2006    Years since quitting: 12.2  . Smokeless tobacco: Never Used  Substance and Sexual Activity  . Alcohol use: No  . Drug use: No  . Sexual activity: Not on file  Lifestyle  . Physical activity:    Days per week: Not on file    Minutes per session: Not on file  . Stress: Not on file  Relationships  . Social connections:    Talks on phone: Not on file    Gets together: Not on file    Attends religious service: Not on file    Active member of club or organization: Not on file    Attends meetings of clubs or organizations: Not on file    Relationship status: Not on file  Other Topics Concern  . Not on file  Social History Narrative  . Not on file     Family History: The patient's family history is not on file.  ROS:   Please see the history of present illness.    All other systems reviewed and are negative.  EKGs/Labs/Other Studies Reviewed:    The following studies were reviewed today: I discussed my findings with the patient at extensive length.   Recent Labs: 12/18/2017: Hemoglobin 15.9; Platelets 250.0; Pro B Natriuretic peptide (BNP) 153.0; TSH 1.46 02/15/2018: BUN 28; Creatinine, Ser 1.30; Potassium 4.7; Sodium 139  Recent Lipid Panel No results found for: CHOL, TRIG, HDL, CHOLHDL, VLDL, LDLCALC, LDLDIRECT  Physical Exam:    VS:  BP (!) 180/76 (BP Location: Right Arm, Patient Position: Sitting, Cuff Size: Normal)   Pulse 80   Ht 5' 6.5" (1.689 m)   Wt 162 lb (73.5 kg)   SpO2 90%   BMI 25.76 kg/m     Wt Readings from Last 3 Encounters:  05/18/18 162 lb (73.5 kg)  02/15/18 174 lb (78.9 kg)  02/07/18 177 lb (80.3 kg)     GEN: Patient is in no acute distress HEENT: Normal NECK: No JVD;  No carotid bruits LYMPHATICS: No lymphadenopathy CARDIAC: Hear sounds regular, 2/6 systolic murmur at the apex. RESPIRATORY:  Clear to auscultation without rales, wheezing or rhonchi  ABDOMEN: Soft, non-tender, non-distended MUSCULOSKELETAL:  No edema; No deformity  SKIN: Warm and dry NEUROLOGIC:  Alert and oriented x 3 PSYCHIATRIC:  Normal  affect   Signed, Garwin Brothers, MD  05/18/2018 2:40 PM    [facility]

## 2018-06-26 DIAGNOSIS — M2550 Pain in unspecified joint: Secondary | ICD-10-CM | POA: Diagnosis not present

## 2018-06-26 DIAGNOSIS — Z79899 Other long term (current) drug therapy: Secondary | ICD-10-CM | POA: Diagnosis not present

## 2018-06-26 DIAGNOSIS — M353 Polymyalgia rheumatica: Secondary | ICD-10-CM | POA: Diagnosis not present

## 2018-07-01 DIAGNOSIS — Z8673 Personal history of transient ischemic attack (TIA), and cerebral infarction without residual deficits: Secondary | ICD-10-CM | POA: Diagnosis not present

## 2018-07-01 DIAGNOSIS — S299XXA Unspecified injury of thorax, initial encounter: Secondary | ICD-10-CM | POA: Diagnosis not present

## 2018-07-01 DIAGNOSIS — Z7952 Long term (current) use of systemic steroids: Secondary | ICD-10-CM | POA: Diagnosis not present

## 2018-07-01 DIAGNOSIS — S300XXA Contusion of lower back and pelvis, initial encounter: Secondary | ICD-10-CM | POA: Diagnosis not present

## 2018-07-01 DIAGNOSIS — M545 Low back pain: Secondary | ICD-10-CM | POA: Diagnosis not present

## 2018-07-01 DIAGNOSIS — J449 Chronic obstructive pulmonary disease, unspecified: Secondary | ICD-10-CM | POA: Diagnosis not present

## 2018-07-01 DIAGNOSIS — I10 Essential (primary) hypertension: Secondary | ICD-10-CM | POA: Diagnosis not present

## 2018-07-01 DIAGNOSIS — I2510 Atherosclerotic heart disease of native coronary artery without angina pectoris: Secondary | ICD-10-CM | POA: Diagnosis not present

## 2018-07-01 DIAGNOSIS — S79922A Unspecified injury of left thigh, initial encounter: Secondary | ICD-10-CM | POA: Diagnosis not present

## 2018-07-01 DIAGNOSIS — Z7982 Long term (current) use of aspirin: Secondary | ICD-10-CM | POA: Diagnosis not present

## 2018-07-01 DIAGNOSIS — S79912A Unspecified injury of left hip, initial encounter: Secondary | ICD-10-CM | POA: Diagnosis not present

## 2018-07-01 DIAGNOSIS — Z951 Presence of aortocoronary bypass graft: Secondary | ICD-10-CM | POA: Diagnosis not present

## 2018-07-01 DIAGNOSIS — I252 Old myocardial infarction: Secondary | ICD-10-CM | POA: Diagnosis not present

## 2018-07-01 DIAGNOSIS — S79911A Unspecified injury of right hip, initial encounter: Secondary | ICD-10-CM | POA: Diagnosis not present

## 2018-07-01 DIAGNOSIS — S3992XA Unspecified injury of lower back, initial encounter: Secondary | ICD-10-CM | POA: Diagnosis not present

## 2018-07-01 DIAGNOSIS — Z79899 Other long term (current) drug therapy: Secondary | ICD-10-CM | POA: Diagnosis not present

## 2018-07-06 DIAGNOSIS — W19XXXA Unspecified fall, initial encounter: Secondary | ICD-10-CM | POA: Diagnosis not present

## 2018-07-06 DIAGNOSIS — M353 Polymyalgia rheumatica: Secondary | ICD-10-CM | POA: Diagnosis not present

## 2018-07-06 DIAGNOSIS — T07XXXA Unspecified multiple injuries, initial encounter: Secondary | ICD-10-CM | POA: Diagnosis not present

## 2018-07-18 DIAGNOSIS — R531 Weakness: Secondary | ICD-10-CM | POA: Diagnosis not present

## 2018-07-18 DIAGNOSIS — R410 Disorientation, unspecified: Secondary | ICD-10-CM | POA: Diagnosis not present

## 2018-07-18 DIAGNOSIS — E7800 Pure hypercholesterolemia, unspecified: Secondary | ICD-10-CM | POA: Diagnosis not present

## 2018-07-18 DIAGNOSIS — F419 Anxiety disorder, unspecified: Secondary | ICD-10-CM | POA: Diagnosis not present

## 2018-07-23 DIAGNOSIS — H401132 Primary open-angle glaucoma, bilateral, moderate stage: Secondary | ICD-10-CM | POA: Diagnosis not present

## 2018-07-23 DIAGNOSIS — H47099 Other disorders of optic nerve, not elsewhere classified, unspecified eye: Secondary | ICD-10-CM | POA: Diagnosis not present

## 2018-07-24 DIAGNOSIS — Z79899 Other long term (current) drug therapy: Secondary | ICD-10-CM | POA: Diagnosis not present

## 2018-07-24 DIAGNOSIS — M353 Polymyalgia rheumatica: Secondary | ICD-10-CM | POA: Diagnosis not present

## 2018-07-24 DIAGNOSIS — M2550 Pain in unspecified joint: Secondary | ICD-10-CM | POA: Diagnosis not present

## 2018-07-25 DIAGNOSIS — Z9981 Dependence on supplemental oxygen: Secondary | ICD-10-CM | POA: Diagnosis not present

## 2018-07-25 DIAGNOSIS — N390 Urinary tract infection, site not specified: Secondary | ICD-10-CM | POA: Diagnosis not present

## 2018-07-25 DIAGNOSIS — Z9181 History of falling: Secondary | ICD-10-CM | POA: Diagnosis not present

## 2018-07-25 DIAGNOSIS — H409 Unspecified glaucoma: Secondary | ICD-10-CM | POA: Diagnosis not present

## 2018-07-25 DIAGNOSIS — I2510 Atherosclerotic heart disease of native coronary artery without angina pectoris: Secondary | ICD-10-CM | POA: Diagnosis not present

## 2018-07-25 DIAGNOSIS — M353 Polymyalgia rheumatica: Secondary | ICD-10-CM | POA: Diagnosis not present

## 2018-07-25 DIAGNOSIS — J449 Chronic obstructive pulmonary disease, unspecified: Secondary | ICD-10-CM | POA: Diagnosis not present

## 2018-07-25 DIAGNOSIS — E7800 Pure hypercholesterolemia, unspecified: Secondary | ICD-10-CM | POA: Diagnosis not present

## 2018-07-25 DIAGNOSIS — Z87891 Personal history of nicotine dependence: Secondary | ICD-10-CM | POA: Diagnosis not present

## 2018-07-25 DIAGNOSIS — Z7952 Long term (current) use of systemic steroids: Secondary | ICD-10-CM | POA: Diagnosis not present

## 2018-07-25 DIAGNOSIS — I10 Essential (primary) hypertension: Secondary | ICD-10-CM | POA: Diagnosis not present

## 2018-07-25 DIAGNOSIS — R7303 Prediabetes: Secondary | ICD-10-CM | POA: Diagnosis not present

## 2018-07-26 DIAGNOSIS — M353 Polymyalgia rheumatica: Secondary | ICD-10-CM | POA: Diagnosis not present

## 2018-07-26 DIAGNOSIS — I2510 Atherosclerotic heart disease of native coronary artery without angina pectoris: Secondary | ICD-10-CM | POA: Diagnosis not present

## 2018-07-26 DIAGNOSIS — N390 Urinary tract infection, site not specified: Secondary | ICD-10-CM | POA: Diagnosis not present

## 2018-07-26 DIAGNOSIS — J449 Chronic obstructive pulmonary disease, unspecified: Secondary | ICD-10-CM | POA: Diagnosis not present

## 2018-07-26 DIAGNOSIS — I10 Essential (primary) hypertension: Secondary | ICD-10-CM | POA: Diagnosis not present

## 2018-07-26 DIAGNOSIS — R7303 Prediabetes: Secondary | ICD-10-CM | POA: Diagnosis not present

## 2018-07-29 ENCOUNTER — Other Ambulatory Visit: Payer: Self-pay | Admitting: Cardiology

## 2018-07-31 DIAGNOSIS — R7303 Prediabetes: Secondary | ICD-10-CM | POA: Diagnosis not present

## 2018-07-31 DIAGNOSIS — N390 Urinary tract infection, site not specified: Secondary | ICD-10-CM | POA: Diagnosis not present

## 2018-07-31 DIAGNOSIS — J449 Chronic obstructive pulmonary disease, unspecified: Secondary | ICD-10-CM | POA: Diagnosis not present

## 2018-07-31 DIAGNOSIS — M353 Polymyalgia rheumatica: Secondary | ICD-10-CM | POA: Diagnosis not present

## 2018-07-31 DIAGNOSIS — I2510 Atherosclerotic heart disease of native coronary artery without angina pectoris: Secondary | ICD-10-CM | POA: Diagnosis not present

## 2018-07-31 DIAGNOSIS — I10 Essential (primary) hypertension: Secondary | ICD-10-CM | POA: Diagnosis not present

## 2018-08-01 DIAGNOSIS — M353 Polymyalgia rheumatica: Secondary | ICD-10-CM | POA: Diagnosis not present

## 2018-08-01 DIAGNOSIS — R7303 Prediabetes: Secondary | ICD-10-CM | POA: Diagnosis not present

## 2018-08-01 DIAGNOSIS — I2510 Atherosclerotic heart disease of native coronary artery without angina pectoris: Secondary | ICD-10-CM | POA: Diagnosis not present

## 2018-08-01 DIAGNOSIS — J449 Chronic obstructive pulmonary disease, unspecified: Secondary | ICD-10-CM | POA: Diagnosis not present

## 2018-08-01 DIAGNOSIS — N390 Urinary tract infection, site not specified: Secondary | ICD-10-CM | POA: Diagnosis not present

## 2018-08-01 DIAGNOSIS — I10 Essential (primary) hypertension: Secondary | ICD-10-CM | POA: Diagnosis not present

## 2018-08-02 DIAGNOSIS — R7303 Prediabetes: Secondary | ICD-10-CM | POA: Diagnosis not present

## 2018-08-02 DIAGNOSIS — M353 Polymyalgia rheumatica: Secondary | ICD-10-CM | POA: Diagnosis not present

## 2018-08-02 DIAGNOSIS — J449 Chronic obstructive pulmonary disease, unspecified: Secondary | ICD-10-CM | POA: Diagnosis not present

## 2018-08-02 DIAGNOSIS — N390 Urinary tract infection, site not specified: Secondary | ICD-10-CM | POA: Diagnosis not present

## 2018-08-02 DIAGNOSIS — I10 Essential (primary) hypertension: Secondary | ICD-10-CM | POA: Diagnosis not present

## 2018-08-02 DIAGNOSIS — I2510 Atherosclerotic heart disease of native coronary artery without angina pectoris: Secondary | ICD-10-CM | POA: Diagnosis not present

## 2018-08-03 DIAGNOSIS — F419 Anxiety disorder, unspecified: Secondary | ICD-10-CM | POA: Diagnosis not present

## 2018-08-03 DIAGNOSIS — R410 Disorientation, unspecified: Secondary | ICD-10-CM | POA: Diagnosis not present

## 2018-08-03 DIAGNOSIS — R451 Restlessness and agitation: Secondary | ICD-10-CM | POA: Diagnosis not present

## 2018-08-03 DIAGNOSIS — R531 Weakness: Secondary | ICD-10-CM | POA: Diagnosis not present

## 2018-08-06 ENCOUNTER — Telehealth: Payer: Self-pay | Admitting: Internal Medicine

## 2018-08-06 NOTE — Telephone Encounter (Signed)
SPoke with Kennon Rounds and she stated she mailed a [employer] form to our address for Dr. Sherene Sires to fill out for pt's oxygen. She would like it faxed back to [facility] [employer] and a copy mailed to their address on file. I will route to [place] for FYI so she can look out for the form.

## 2018-08-07 DIAGNOSIS — N390 Urinary tract infection, site not specified: Secondary | ICD-10-CM | POA: Diagnosis not present

## 2018-08-07 DIAGNOSIS — J449 Chronic obstructive pulmonary disease, unspecified: Secondary | ICD-10-CM | POA: Diagnosis not present

## 2018-08-07 DIAGNOSIS — M353 Polymyalgia rheumatica: Secondary | ICD-10-CM | POA: Diagnosis not present

## 2018-08-07 DIAGNOSIS — I2510 Atherosclerotic heart disease of native coronary artery without angina pectoris: Secondary | ICD-10-CM | POA: Diagnosis not present

## 2018-08-07 DIAGNOSIS — I10 Essential (primary) hypertension: Secondary | ICD-10-CM | POA: Diagnosis not present

## 2018-08-07 DIAGNOSIS — R7303 Prediabetes: Secondary | ICD-10-CM | POA: Diagnosis not present

## 2018-08-08 NOTE — Telephone Encounter (Signed)
Checked MW's incoming mail for this [employer] form, did not see it in his box.  Verlon Au, have you or MW received this form yet? Thank you.

## 2018-08-08 NOTE — Telephone Encounter (Signed)
Still not received.

## 2018-08-09 DIAGNOSIS — M353 Polymyalgia rheumatica: Secondary | ICD-10-CM | POA: Diagnosis not present

## 2018-08-09 DIAGNOSIS — I2510 Atherosclerotic heart disease of native coronary artery without angina pectoris: Secondary | ICD-10-CM | POA: Diagnosis not present

## 2018-08-09 DIAGNOSIS — J449 Chronic obstructive pulmonary disease, unspecified: Secondary | ICD-10-CM | POA: Diagnosis not present

## 2018-08-09 DIAGNOSIS — R7303 Prediabetes: Secondary | ICD-10-CM | POA: Diagnosis not present

## 2018-08-09 DIAGNOSIS — I10 Essential (primary) hypertension: Secondary | ICD-10-CM | POA: Diagnosis not present

## 2018-08-09 DIAGNOSIS — N390 Urinary tract infection, site not specified: Secondary | ICD-10-CM | POA: Diagnosis not present

## 2018-08-13 DIAGNOSIS — I2510 Atherosclerotic heart disease of native coronary artery without angina pectoris: Secondary | ICD-10-CM | POA: Diagnosis not present

## 2018-08-13 DIAGNOSIS — J449 Chronic obstructive pulmonary disease, unspecified: Secondary | ICD-10-CM | POA: Diagnosis not present

## 2018-08-13 DIAGNOSIS — I10 Essential (primary) hypertension: Secondary | ICD-10-CM | POA: Diagnosis not present

## 2018-08-13 DIAGNOSIS — N390 Urinary tract infection, site not specified: Secondary | ICD-10-CM | POA: Diagnosis not present

## 2018-08-13 DIAGNOSIS — R7303 Prediabetes: Secondary | ICD-10-CM | POA: Diagnosis not present

## 2018-08-13 DIAGNOSIS — M353 Polymyalgia rheumatica: Secondary | ICD-10-CM | POA: Diagnosis not present

## 2018-08-13 NOTE — Telephone Encounter (Signed)
Called and spoke with pt's wife Gay Filler letting her know that we still haven't received the form that was mailed to office for MW to fill out. Gay Filler stated she would call [employer] to obtain another form and then would get it to our office. Will keep encounter open so we can keep a lookout for the form.

## 2018-08-14 DIAGNOSIS — E785 Hyperlipidemia, unspecified: Secondary | ICD-10-CM | POA: Diagnosis not present

## 2018-08-14 DIAGNOSIS — Z9181 History of falling: Secondary | ICD-10-CM | POA: Diagnosis not present

## 2018-08-14 DIAGNOSIS — Z0000 Encounter for general adult medical examination without abnormal findings: Secondary | ICD-10-CM | POA: Diagnosis not present

## 2018-08-14 DIAGNOSIS — Z1331 Encounter for screening for depression: Secondary | ICD-10-CM | POA: Diagnosis not present

## 2018-08-15 DIAGNOSIS — M353 Polymyalgia rheumatica: Secondary | ICD-10-CM | POA: Diagnosis not present

## 2018-08-15 DIAGNOSIS — N390 Urinary tract infection, site not specified: Secondary | ICD-10-CM | POA: Diagnosis not present

## 2018-08-15 DIAGNOSIS — I2510 Atherosclerotic heart disease of native coronary artery without angina pectoris: Secondary | ICD-10-CM | POA: Diagnosis not present

## 2018-08-15 DIAGNOSIS — R7303 Prediabetes: Secondary | ICD-10-CM | POA: Diagnosis not present

## 2018-08-15 DIAGNOSIS — J449 Chronic obstructive pulmonary disease, unspecified: Secondary | ICD-10-CM | POA: Diagnosis not present

## 2018-08-15 DIAGNOSIS — I10 Essential (primary) hypertension: Secondary | ICD-10-CM | POA: Diagnosis not present

## 2018-08-15 NOTE — Telephone Encounter (Signed)
Spoke with Magda Paganini and she confirmed the form was received and placed in Dr. Gustavus Bryant look out folder. Dr. Melvyn Novas is out of the office on vacation the week of 08/13/18. Expected to return to the office on Monday 08/20/18.   Called and spoke with patient's wife Gay Filler and advised her the form was received and will be signed upon Dr. Gustavus Bryant return. Nothing further needed at this time.

## 2018-08-21 DIAGNOSIS — I10 Essential (primary) hypertension: Secondary | ICD-10-CM | POA: Diagnosis not present

## 2018-08-21 DIAGNOSIS — R7303 Prediabetes: Secondary | ICD-10-CM | POA: Diagnosis not present

## 2018-08-21 DIAGNOSIS — M353 Polymyalgia rheumatica: Secondary | ICD-10-CM | POA: Diagnosis not present

## 2018-08-21 DIAGNOSIS — N390 Urinary tract infection, site not specified: Secondary | ICD-10-CM | POA: Diagnosis not present

## 2018-08-21 DIAGNOSIS — J449 Chronic obstructive pulmonary disease, unspecified: Secondary | ICD-10-CM | POA: Diagnosis not present

## 2018-08-21 DIAGNOSIS — I2510 Atherosclerotic heart disease of native coronary artery without angina pectoris: Secondary | ICD-10-CM | POA: Diagnosis not present

## 2018-08-22 DIAGNOSIS — M353 Polymyalgia rheumatica: Secondary | ICD-10-CM | POA: Diagnosis not present

## 2018-08-22 DIAGNOSIS — I10 Essential (primary) hypertension: Secondary | ICD-10-CM | POA: Diagnosis not present

## 2018-08-22 DIAGNOSIS — R7303 Prediabetes: Secondary | ICD-10-CM | POA: Diagnosis not present

## 2018-08-22 DIAGNOSIS — I2510 Atherosclerotic heart disease of native coronary artery without angina pectoris: Secondary | ICD-10-CM | POA: Diagnosis not present

## 2018-08-22 DIAGNOSIS — J449 Chronic obstructive pulmonary disease, unspecified: Secondary | ICD-10-CM | POA: Diagnosis not present

## 2018-08-22 DIAGNOSIS — N390 Urinary tract infection, site not specified: Secondary | ICD-10-CM | POA: Diagnosis not present

## 2018-08-24 DIAGNOSIS — Z9181 History of falling: Secondary | ICD-10-CM | POA: Diagnosis not present

## 2018-08-24 DIAGNOSIS — I2510 Atherosclerotic heart disease of native coronary artery without angina pectoris: Secondary | ICD-10-CM | POA: Diagnosis not present

## 2018-08-24 DIAGNOSIS — I10 Essential (primary) hypertension: Secondary | ICD-10-CM | POA: Diagnosis not present

## 2018-08-24 DIAGNOSIS — M353 Polymyalgia rheumatica: Secondary | ICD-10-CM | POA: Diagnosis not present

## 2018-08-24 DIAGNOSIS — J449 Chronic obstructive pulmonary disease, unspecified: Secondary | ICD-10-CM | POA: Diagnosis not present

## 2018-08-24 DIAGNOSIS — Z7952 Long term (current) use of systemic steroids: Secondary | ICD-10-CM | POA: Diagnosis not present

## 2018-08-24 DIAGNOSIS — R7303 Prediabetes: Secondary | ICD-10-CM | POA: Diagnosis not present

## 2018-08-24 DIAGNOSIS — N390 Urinary tract infection, site not specified: Secondary | ICD-10-CM | POA: Diagnosis not present

## 2018-08-24 DIAGNOSIS — Z87891 Personal history of nicotine dependence: Secondary | ICD-10-CM | POA: Diagnosis not present

## 2018-08-24 DIAGNOSIS — Z9981 Dependence on supplemental oxygen: Secondary | ICD-10-CM | POA: Diagnosis not present

## 2018-08-24 DIAGNOSIS — H409 Unspecified glaucoma: Secondary | ICD-10-CM | POA: Diagnosis not present

## 2018-08-24 DIAGNOSIS — E7800 Pure hypercholesterolemia, unspecified: Secondary | ICD-10-CM | POA: Diagnosis not present

## 2018-08-28 DIAGNOSIS — I2510 Atherosclerotic heart disease of native coronary artery without angina pectoris: Secondary | ICD-10-CM | POA: Diagnosis not present

## 2018-08-28 DIAGNOSIS — R7303 Prediabetes: Secondary | ICD-10-CM | POA: Diagnosis not present

## 2018-08-28 DIAGNOSIS — N390 Urinary tract infection, site not specified: Secondary | ICD-10-CM | POA: Diagnosis not present

## 2018-08-28 DIAGNOSIS — J449 Chronic obstructive pulmonary disease, unspecified: Secondary | ICD-10-CM | POA: Diagnosis not present

## 2018-08-28 DIAGNOSIS — I10 Essential (primary) hypertension: Secondary | ICD-10-CM | POA: Diagnosis not present

## 2018-08-28 DIAGNOSIS — M353 Polymyalgia rheumatica: Secondary | ICD-10-CM | POA: Diagnosis not present

## 2018-08-31 DIAGNOSIS — J449 Chronic obstructive pulmonary disease, unspecified: Secondary | ICD-10-CM | POA: Diagnosis not present

## 2018-08-31 DIAGNOSIS — I10 Essential (primary) hypertension: Secondary | ICD-10-CM | POA: Diagnosis not present

## 2018-08-31 DIAGNOSIS — N390 Urinary tract infection, site not specified: Secondary | ICD-10-CM | POA: Diagnosis not present

## 2018-08-31 DIAGNOSIS — M353 Polymyalgia rheumatica: Secondary | ICD-10-CM | POA: Diagnosis not present

## 2018-08-31 DIAGNOSIS — I2510 Atherosclerotic heart disease of native coronary artery without angina pectoris: Secondary | ICD-10-CM | POA: Diagnosis not present

## 2018-08-31 DIAGNOSIS — R7303 Prediabetes: Secondary | ICD-10-CM | POA: Diagnosis not present

## 2018-09-02 ENCOUNTER — Other Ambulatory Visit: Payer: Self-pay | Admitting: Cardiology

## 2018-09-03 DIAGNOSIS — M353 Polymyalgia rheumatica: Secondary | ICD-10-CM | POA: Diagnosis not present

## 2018-09-03 DIAGNOSIS — N390 Urinary tract infection, site not specified: Secondary | ICD-10-CM | POA: Diagnosis not present

## 2018-09-03 DIAGNOSIS — I2510 Atherosclerotic heart disease of native coronary artery without angina pectoris: Secondary | ICD-10-CM | POA: Diagnosis not present

## 2018-09-03 DIAGNOSIS — R7303 Prediabetes: Secondary | ICD-10-CM | POA: Diagnosis not present

## 2018-09-03 DIAGNOSIS — I10 Essential (primary) hypertension: Secondary | ICD-10-CM | POA: Diagnosis not present

## 2018-09-03 DIAGNOSIS — J449 Chronic obstructive pulmonary disease, unspecified: Secondary | ICD-10-CM | POA: Diagnosis not present

## 2018-09-04 DIAGNOSIS — I10 Essential (primary) hypertension: Secondary | ICD-10-CM | POA: Diagnosis not present

## 2018-09-04 DIAGNOSIS — R7303 Prediabetes: Secondary | ICD-10-CM | POA: Diagnosis not present

## 2018-09-04 DIAGNOSIS — I2510 Atherosclerotic heart disease of native coronary artery without angina pectoris: Secondary | ICD-10-CM | POA: Diagnosis not present

## 2018-09-04 DIAGNOSIS — J449 Chronic obstructive pulmonary disease, unspecified: Secondary | ICD-10-CM | POA: Diagnosis not present

## 2018-09-04 DIAGNOSIS — M353 Polymyalgia rheumatica: Secondary | ICD-10-CM | POA: Diagnosis not present

## 2018-09-04 DIAGNOSIS — N390 Urinary tract infection, site not specified: Secondary | ICD-10-CM | POA: Diagnosis not present

## 2018-09-05 DIAGNOSIS — N390 Urinary tract infection, site not specified: Secondary | ICD-10-CM | POA: Diagnosis not present

## 2018-09-05 DIAGNOSIS — I2510 Atherosclerotic heart disease of native coronary artery without angina pectoris: Secondary | ICD-10-CM | POA: Diagnosis not present

## 2018-09-05 DIAGNOSIS — I10 Essential (primary) hypertension: Secondary | ICD-10-CM | POA: Diagnosis not present

## 2018-09-05 DIAGNOSIS — J449 Chronic obstructive pulmonary disease, unspecified: Secondary | ICD-10-CM | POA: Diagnosis not present

## 2018-09-05 DIAGNOSIS — M353 Polymyalgia rheumatica: Secondary | ICD-10-CM | POA: Diagnosis not present

## 2018-09-05 DIAGNOSIS — R7303 Prediabetes: Secondary | ICD-10-CM | POA: Diagnosis not present

## 2018-09-12 DIAGNOSIS — N390 Urinary tract infection, site not specified: Secondary | ICD-10-CM | POA: Diagnosis not present

## 2018-09-12 DIAGNOSIS — I10 Essential (primary) hypertension: Secondary | ICD-10-CM | POA: Diagnosis not present

## 2018-09-12 DIAGNOSIS — M353 Polymyalgia rheumatica: Secondary | ICD-10-CM | POA: Diagnosis not present

## 2018-09-12 DIAGNOSIS — I2510 Atherosclerotic heart disease of native coronary artery without angina pectoris: Secondary | ICD-10-CM | POA: Diagnosis not present

## 2018-09-12 DIAGNOSIS — R7303 Prediabetes: Secondary | ICD-10-CM | POA: Diagnosis not present

## 2018-09-12 DIAGNOSIS — J449 Chronic obstructive pulmonary disease, unspecified: Secondary | ICD-10-CM | POA: Diagnosis not present

## 2018-09-17 DIAGNOSIS — J449 Chronic obstructive pulmonary disease, unspecified: Secondary | ICD-10-CM | POA: Diagnosis not present

## 2018-09-17 DIAGNOSIS — R7303 Prediabetes: Secondary | ICD-10-CM | POA: Diagnosis not present

## 2018-09-17 DIAGNOSIS — N390 Urinary tract infection, site not specified: Secondary | ICD-10-CM | POA: Diagnosis not present

## 2018-09-17 DIAGNOSIS — I2510 Atherosclerotic heart disease of native coronary artery without angina pectoris: Secondary | ICD-10-CM | POA: Diagnosis not present

## 2018-09-17 DIAGNOSIS — M353 Polymyalgia rheumatica: Secondary | ICD-10-CM | POA: Diagnosis not present

## 2018-09-17 DIAGNOSIS — I10 Essential (primary) hypertension: Secondary | ICD-10-CM | POA: Diagnosis not present

## 2018-09-20 DIAGNOSIS — I2510 Atherosclerotic heart disease of native coronary artery without angina pectoris: Secondary | ICD-10-CM | POA: Diagnosis not present

## 2018-09-20 DIAGNOSIS — N390 Urinary tract infection, site not specified: Secondary | ICD-10-CM | POA: Diagnosis not present

## 2018-09-20 DIAGNOSIS — J449 Chronic obstructive pulmonary disease, unspecified: Secondary | ICD-10-CM | POA: Diagnosis not present

## 2018-09-20 DIAGNOSIS — R7303 Prediabetes: Secondary | ICD-10-CM | POA: Diagnosis not present

## 2018-09-20 DIAGNOSIS — I10 Essential (primary) hypertension: Secondary | ICD-10-CM | POA: Diagnosis not present

## 2018-09-20 DIAGNOSIS — M353 Polymyalgia rheumatica: Secondary | ICD-10-CM | POA: Diagnosis not present

## 2018-10-15 DIAGNOSIS — R410 Disorientation, unspecified: Secondary | ICD-10-CM | POA: Diagnosis not present

## 2018-10-15 DIAGNOSIS — F419 Anxiety disorder, unspecified: Secondary | ICD-10-CM | POA: Diagnosis not present

## 2018-10-15 DIAGNOSIS — R531 Weakness: Secondary | ICD-10-CM | POA: Diagnosis not present

## 2018-10-15 DIAGNOSIS — E7800 Pure hypercholesterolemia, unspecified: Secondary | ICD-10-CM | POA: Diagnosis not present

## 2018-10-15 DIAGNOSIS — Z139 Encounter for screening, unspecified: Secondary | ICD-10-CM | POA: Diagnosis not present

## 2018-10-15 DIAGNOSIS — E119 Type 2 diabetes mellitus without complications: Secondary | ICD-10-CM | POA: Diagnosis not present

## 2018-10-25 DIAGNOSIS — M2550 Pain in unspecified joint: Secondary | ICD-10-CM | POA: Diagnosis not present

## 2018-10-25 DIAGNOSIS — M353 Polymyalgia rheumatica: Secondary | ICD-10-CM | POA: Diagnosis not present

## 2018-10-25 DIAGNOSIS — Z79899 Other long term (current) drug therapy: Secondary | ICD-10-CM | POA: Diagnosis not present

## 2018-11-01 DIAGNOSIS — R220 Localized swelling, mass and lump, head: Secondary | ICD-10-CM | POA: Diagnosis not present

## 2018-11-01 DIAGNOSIS — R21 Rash and other nonspecific skin eruption: Secondary | ICD-10-CM | POA: Diagnosis not present

## 2018-11-09 ENCOUNTER — Other Ambulatory Visit: Payer: Self-pay | Admitting: Cardiology

## 2018-11-20 ENCOUNTER — Ambulatory Visit: Payer: Medicare Other | Admitting: Cardiology

## 2018-11-21 DIAGNOSIS — R451 Restlessness and agitation: Secondary | ICD-10-CM | POA: Diagnosis not present

## 2018-11-21 DIAGNOSIS — R300 Dysuria: Secondary | ICD-10-CM | POA: Diagnosis not present

## 2018-11-21 DIAGNOSIS — K5900 Constipation, unspecified: Secondary | ICD-10-CM | POA: Diagnosis not present

## 2018-11-21 DIAGNOSIS — N400 Enlarged prostate without lower urinary tract symptoms: Secondary | ICD-10-CM | POA: Diagnosis not present

## 2018-11-22 ENCOUNTER — Encounter: Payer: Self-pay | Admitting: Cardiology

## 2018-11-22 ENCOUNTER — Other Ambulatory Visit: Payer: Self-pay

## 2018-11-22 ENCOUNTER — Ambulatory Visit (INDEPENDENT_AMBULATORY_CARE_PROVIDER_SITE_OTHER): Payer: Medicare Other | Admitting: Cardiology

## 2018-11-22 VITALS — BP 118/48 | HR 83 | Wt 163.4 lb

## 2018-11-22 DIAGNOSIS — I2510 Atherosclerotic heart disease of native coronary artery without angina pectoris: Secondary | ICD-10-CM

## 2018-11-22 DIAGNOSIS — I10 Essential (primary) hypertension: Secondary | ICD-10-CM

## 2018-11-22 DIAGNOSIS — G4733 Obstructive sleep apnea (adult) (pediatric): Secondary | ICD-10-CM | POA: Diagnosis not present

## 2018-11-22 DIAGNOSIS — E088 Diabetes mellitus due to underlying condition with unspecified complications: Secondary | ICD-10-CM

## 2018-11-22 DIAGNOSIS — J431 Panlobular emphysema: Secondary | ICD-10-CM | POA: Diagnosis not present

## 2018-11-22 DIAGNOSIS — E785 Hyperlipidemia, unspecified: Secondary | ICD-10-CM

## 2018-11-22 MED ORDER — AMLODIPINE BESYLATE 2.5 MG PO TABS: 2.5000 mg | ORAL_TABLET | Freq: Every day | ORAL | 4 refills | Status: DC

## 2018-11-22 MED ORDER — RANOLAZINE ER 1000 MG PO TB12: ORAL_TABLET | ORAL | 4 refills | Status: DC

## 2018-11-22 NOTE — Patient Instructions (Addendum)
Medication Instructions:  Your physician recommends that you continue on your current medications as directed. Please refer to the Current Medication list given to you today.  If you need a refill on your cardiac medications before your next appointment, please call your pharmacy.   Lab work: NONE If you have labs (blood work) drawn today and your tests are completely normal, you will receive your results only by: . MyChart Message (if you have MyChart) OR . A paper copy in the mail If you have any lab test that is abnormal or we need to change your treatment, we will call you to review the results.  Testing/Procedures: You had an EKG performed today  Follow-Up: At [facility], you and your health needs are our priority.  As part of our continuing mission to provide you with exceptional heart care, we have created designated Provider Care Teams.  These Care Teams include your primary Cardiologist (physician) and Advanced Practice Providers (APPs -  Physician Assistants and Nurse Practitioners) who all work together to provide you with the care you need, when you need it. You will need a follow up appointment in 6 months.   

## 2018-11-22 NOTE — Progress Notes (Signed)
Cardiology Office Note:    Date:  11/22/2018   ID:  Christopher Kline, DOB [birth year], MRN [id number]  PCP:  Marlyn Corporal, PA  Cardiologist:  Garwin Brothers, MD   Referring MD: Marlyn Corporal, PA    ASSESSMENT:    1. Coronary artery disease involving native coronary artery of native heart without angina pectoris   2. Essential hypertension   3. OSA (obstructive sleep apnea)   4. Panlobular emphysema (HCC)   5. Diabetes mellitus due to underlying condition with unspecified complications (HCC)   6. Dyslipidemia    PLAN:    In order of problems listed above:  1. Secondary prevention stressed with the patient.  Importance of compliance with diet and medication stressed and he vocalized understanding. 2. Essential hypertension: His blood pressure stable.  I gave him a choice of changing his telmisartan to lisinopril or amlodipine but the patient refuses.  He wants to continue this medication as it is working well for his blood pressure.  He will let us know in the next few days if things do not improve.  I respect his and his wife's wishes. 3. Mixed dyslipidemia: Diet was discussed.  Lipids followed by primary care physician.  Diet was also discussed for dyslipidemia and diabetes mellitus. 4. Patient will be seen in follow-up appointment in 6 months or earlier if the patient has any concerns    Medication Adjustments/Labs and Tests Ordered: Current medicines are reviewed at length with the patient today.  Concerns regarding medicines are outlined above.  No orders of the defined types were placed in this encounter.  No orders of the defined types were placed in this encounter.    No chief complaint on file.    History of Present Illness:    Christopher Kline is a 85 y.o. male.  Patient has past medical history of coronary artery disease, essential hypertension and dyslipidemia.  He denies any problems at this time and takes care of activities of daily living.  No chest pain orthopnea  or PND.  He has some occasional itching around his mouth and his doctor wanted to stop his telmisartan but the patient refused and he has been prescribed some lotion for the rash.  At the time of my evaluation, the patient is alert awake oriented and in no distress.  Past Medical History:  Diagnosis Date  . CAD (coronary artery disease)   . Chronic kidney disease   . COPD (chronic obstructive pulmonary disease) (HCC)   . Glaucoma   . Hyperlipidemia   . Hypertension   . SOB (shortness of breath)     Past Surgical History:  Procedure Laterality Date  . CORONARY ARTERY BYPASS GRAFT      Current Medications: Current Meds  Medication Sig  . aspirin EC 81 MG tablet Take 81 mg by mouth daily.  . carvedilol (COREG) 6.25 MG tablet TAKE 1 TABLET BY MOUTH TWO  TIMES DAILY  . dorzolamide (TRUSOPT) 2 % ophthalmic solution Place 1 drop into both eyes 2 (two) times daily.  Marland Kitchen latanoprost (XALATAN) 0.005 % ophthalmic solution Place 1 drop into both eyes daily.  . Lutein 40 MG CAPS Take 1 capsule by mouth daily.  . Multiple Vitamin (MULTIVITAMIN) tablet Take 1 tablet by mouth daily.  . nitrofurantoin, macrocrystal-monohydrate, (MACROBID) 100 MG capsule   . predniSONE (DELTASONE) 5 MG tablet Take 6 mg by mouth daily with breakfast. Per Dr. Herma Carson  One 5 mg and 1 mg daily  . PROAIR  HFA 108 (90 Base) MCG/ACT inhaler Inhale 2 puffs into the lungs every 4 (four) hours as needed.   . risperiDONE (RISPERDAL) 2 MG tablet   . tamsulosin (FLOMAX) 0.4 MG CAPS capsule   . telmisartan (MICARDIS) 20 MG tablet TAKE 1 TABLET BY MOUTH  DAILY  . Tiotropium Bromide-Olodaterol (STIOLTO RESPIMAT) 2.5-2.5 MCG/ACT AERS Inhale 2 puffs into the lungs daily.  . vitamin B-12 (CYANOCOBALAMIN) 1000 MCG tablet Take 1,000 mcg by mouth daily.     Allergies:   Vancomycin   Social History   Socioeconomic History  . Marital status: Married    Spouse name: Not on file  . Number of children: Not on file  . Years of education: Not  on file  . Highest education level: Not on file  Occupational History  . Not on file  Social Needs  . Financial resource strain: Not on file  . Food insecurity    Worry: Not on file    Inability: Not on file  . Transportation needs    Medical: Not on file    Non-medical: Not on file  Tobacco Use  . Smoking status: Former Smoker    Packs/day: 1.00    Years: 65.00    Pack years: 65.00    Types: Cigarettes, Pipe    Quit date: 03/07/2006    Years since quitting: 12.7  . Smokeless tobacco: Never Used  Substance and Sexual Activity  . Alcohol use: No  . Drug use: No  . Sexual activity: Not on file  Lifestyle  . Physical activity    Days per week: Not on file    Minutes per session: Not on file  . Stress: Not on file  Relationships  . Social Herbalist on phone: Not on file    Gets together: Not on file    Attends religious service: Not on file    Active member of club or organization: Not on file    Attends meetings of clubs or organizations: Not on file    Relationship status: Not on file  Other Topics Concern  . Not on file  Social History Narrative  . Not on file     Family History: The patient's family history is not on file.  ROS:   Please see the history of present illness.    All other systems reviewed and are negative.  EKGs/Labs/Other Studies Reviewed:    The following studies were reviewed today: EKG reveals sinus rhythm and nonspecific ST-T changes   Recent Labs: 12/18/2017: Hemoglobin 15.9; Platelets 250.0; Pro B Natriuretic peptide (BNP) 153.0; TSH 1.46 02/15/2018: BUN 28; Creatinine, Ser 1.30; Potassium 4.7; Sodium 139  Recent Lipid Panel No results found for: CHOL, TRIG, HDL, CHOLHDL, VLDL, LDLCALC, LDLDIRECT  Physical Exam:    VS:  BP (!) 118/48 (BP Location: Left Arm, Patient Position: Sitting, Cuff Size: Normal)   Pulse 83   Wt 163 lb 6.4 oz (74.1 kg)   SpO2 91%   BMI 25.98 kg/m     Wt Readings from Last 3 Encounters:   11/22/18 163 lb 6.4 oz (74.1 kg)  05/18/18 162 lb (73.5 kg)  02/15/18 174 lb (78.9 kg)     GEN: Patient is in no acute distress HEENT: Normal NECK: No JVD; No carotid bruits LYMPHATICS: No lymphadenopathy CARDIAC: Hear sounds regular, 2/6 systolic murmur at the apex. RESPIRATORY:  Clear to auscultation without rales, wheezing or rhonchi  ABDOMEN: Soft, non-tender, non-distended MUSCULOSKELETAL:  No edema; No deformity  SKIN:  Warm and dry NEUROLOGIC:  Alert and oriented x 3 PSYCHIATRIC:  Normal affect   Signed, Garwin Brothersajan R [place]ne Ridge, MD  11/22/2018 4:16 PM    [facility]

## 2018-11-26 DIAGNOSIS — H5203 Hypermetropia, bilateral: Secondary | ICD-10-CM | POA: Diagnosis not present

## 2018-11-26 DIAGNOSIS — H401133 Primary open-angle glaucoma, bilateral, severe stage: Secondary | ICD-10-CM | POA: Diagnosis not present

## 2018-12-08 DIAGNOSIS — Z23 Encounter for immunization: Secondary | ICD-10-CM | POA: Diagnosis not present

## 2018-12-14 ENCOUNTER — Telehealth: Payer: Self-pay | Admitting: Cardiology

## 2018-12-14 NOTE — Telephone Encounter (Signed)
Call [facility]

## 2018-12-17 DIAGNOSIS — N390 Urinary tract infection, site not specified: Secondary | ICD-10-CM | POA: Diagnosis not present

## 2018-12-20 MED ORDER — TELMISARTAN 20 MG PO TABS: 20.0000 mg | ORAL_TABLET | Freq: Every day | ORAL | 1 refills | Status: DC

## 2018-12-20 NOTE — Addendum Note (Signed)
Addended by: Beckey Rutter on: 12/20/2018 09:38 AM   Modules accepted: Orders

## 2018-12-24 DIAGNOSIS — N390 Urinary tract infection, site not specified: Secondary | ICD-10-CM | POA: Diagnosis not present

## 2018-12-24 DIAGNOSIS — E119 Type 2 diabetes mellitus without complications: Secondary | ICD-10-CM | POA: Diagnosis not present

## 2018-12-24 DIAGNOSIS — Z6824 Body mass index (BMI) 24.0-24.9, adult: Secondary | ICD-10-CM | POA: Diagnosis not present

## 2018-12-24 DIAGNOSIS — F419 Anxiety disorder, unspecified: Secondary | ICD-10-CM | POA: Diagnosis not present

## 2018-12-25 ENCOUNTER — Telehealth: Payer: Self-pay | Admitting: Internal Medicine

## 2018-12-25 NOTE — Telephone Encounter (Signed)
Called spoke with patient spouse Vicente Males Gay Filler) and appt scheduled with MW for 11.29.2020 @ 2pm for yearly follow up and O2 recert.  Nothing further needed; will sign off.

## 2019-01-03 ENCOUNTER — Other Ambulatory Visit: Payer: Self-pay

## 2019-01-03 ENCOUNTER — Ambulatory Visit: Payer: Medicare Other | Admitting: Internal Medicine

## 2019-01-04 ENCOUNTER — Other Ambulatory Visit: Payer: Self-pay

## 2019-01-04 ENCOUNTER — Ambulatory Visit (INDEPENDENT_AMBULATORY_CARE_PROVIDER_SITE_OTHER): Payer: Medicare Other | Admitting: Sports Medicine

## 2019-01-04 ENCOUNTER — Encounter: Payer: Self-pay | Admitting: Sports Medicine

## 2019-01-04 DIAGNOSIS — M79609 Pain in unspecified limb: Secondary | ICD-10-CM

## 2019-01-04 DIAGNOSIS — B351 Tinea unguium: Secondary | ICD-10-CM | POA: Diagnosis not present

## 2019-01-04 DIAGNOSIS — L600 Ingrowing nail: Secondary | ICD-10-CM

## 2019-01-04 DIAGNOSIS — M79676 Pain in unspecified toe(s): Secondary | ICD-10-CM | POA: Diagnosis not present

## 2019-01-04 DIAGNOSIS — E088 Diabetes mellitus due to underlying condition with unspecified complications: Secondary | ICD-10-CM | POA: Diagnosis not present

## 2019-01-04 MED ORDER — NEOMYCIN-POLYMYXIN-HC 3.5-10000-1 OT SOLN: OTIC | 0 refills | Status: DC

## 2019-01-04 NOTE — Progress Notes (Signed)
Subjective: Christopher Kline is a 85 y.o. male patient with history of diabetes who presents to office today complaining of long,mildly painful nail  while ambulating in shoes; unable to trim.  Wife helps to report history of the left big toenail digging into the skin with some soreness over the last 3 to 4 months with throbbing.  Patient and wife denies any redness or drainage but does admit to a little bit of swelling.  Patient tried to trim but could not trim them well and wife told him to stop because she was afraid that he may cut himself but does report that when he spoke with hot water that seemed to help ease off some of the soreness around the left big toe.  Patient is diet controlled diabetic and does not check sugars denies any complications as a result of diabetes.  No other pedal complaints noted.    Review of Systems  All other systems reviewed and are negative.    Patient Active Problem List   Diagnosis Date Noted  . OSA (obstructive sleep apnea) 12/29/2017  . Upper airway cough syndrome 12/18/2017  . Chronic respiratory failure with hypoxia (HCC) 12/18/2017  . DOE (dyspnea on exertion) 12/15/2017  . COPD GOLD II with disproportionate reduction in DLCO 11/16/2017  . Diabetes mellitus due to underlying condition with unspecified complications (HCC) 04/17/2017  . Allergic rhinitis 05/09/2015  . COPD (chronic obstructive pulmonary disease) (HCC) 05/09/2015  . Drug therapy 05/09/2015  . Dryness of eye 05/09/2015  . Pain in joints 05/09/2015  . Chronic pain syndrome 05/09/2015  . Pleural effusion 05/09/2015  . Retinal artery occlusion 05/09/2015  . Unspecified glaucoma 05/09/2015  . Vitamin D deficiency 05/09/2015  . Coronary artery disease involving native coronary artery of native heart without angina pectoris 10/07/2014  . Dyslipidemia 10/07/2014  . Essential hypertension 10/07/2014   Current Outpatient Medications on File Prior to Visit  Medication Sig Dispense Refill  .  aspirin EC 81 MG tablet Take 81 mg by mouth daily.    Marland Kitchen atorvastatin (LIPITOR) 80 MG tablet TAKE ONE-HALF TABLET BY  MOUTH DAILY AT 6 PM. (Patient not taking: Reported on 11/22/2018) 45 tablet 1  . carvedilol (COREG) 6.25 MG tablet TAKE 1 TABLET BY MOUTH TWO  TIMES DAILY 180 tablet 0  . citalopram (CELEXA) 10 MG tablet TAKE 1 TABLET BY MOUTH AT BEDTIME FOR 7 DAYS THEN CAN INCREASE TO 2 NIGHTLY    . dorzolamide (TRUSOPT) 2 % ophthalmic solution Place 1 drop into both eyes 2 (two) times daily.    Marland Kitchen latanoprost (XALATAN) 0.005 % ophthalmic solution Place 1 drop into both eyes daily.    . Lutein 40 MG CAPS Take 1 capsule by mouth daily.    . metroNIDAZOLE (METROGEL) 1 % gel Apply topically 2 (two) times daily.    . Multiple Vitamin (MULTIVITAMIN) tablet Take 1 tablet by mouth daily.    . nitrofurantoin, macrocrystal-monohydrate, (MACROBID) 100 MG capsule     . nitroGLYCERIN (NITROSTAT) 0.4 MG SL tablet Place 1 tablet (0.4 mg total) under the tongue every 5 (five) minutes as needed. 11 tablet 6  . predniSONE (DELTASONE) 5 MG tablet Take 6 mg by mouth daily with breakfast. Per Dr. Herma Carson  One 5 mg and 1 mg daily    . PROAIR HFA 108 (90 Base) MCG/ACT inhaler Inhale 2 puffs into the lungs every 4 (four) hours as needed.     . risperiDONE (RISPERDAL) 2 MG tablet     . tamsulosin (  FLOMAX) 0.4 MG CAPS capsule     . telmisartan (MICARDIS) 20 MG tablet Take 1 tablet (20 mg total) by mouth daily. 90 tablet 1  . Tiotropium Bromide-Olodaterol (STIOLTO RESPIMAT) 2.5-2.5 MCG/ACT AERS Inhale 2 puffs into the lungs daily. 1 Inhaler 0  . vitamin B-12 (CYANOCOBALAMIN) 1000 MCG tablet Take 1,000 mcg by mouth daily.     No current facility-administered medications on file prior to visit.    Allergies  Allergen Reactions  . Vancomycin Rash    No results found for this or any previous visit (from the past 2160 hour(s)).  Objective: General: Patient is awake, alert, and oriented x 3 and in no acute  distress.  Integument: Skin is warm, dry and supple bilateral. Nails are tender, long, thickened and  dystrophic with subungual debris, consistent with onychomycosis, 1-5 bilateral with mild ingrowing noted at the left hallux medial border. No signs of infection. No open lesions or preulcerative lesions present bilateral. Remaining integument unremarkable.  Vasculature:  Dorsalis Pedis pulse 1/4 bilateral. Posterior Tibial pulse  1/4 bilateral.  Capillary fill time <3 sec 1-5 bilateral. Positive hair growth to the level of the digits. Temperature gradient within normal limits. No varicosities present bilateral. No edema present bilateral.   Neurology: Gross sensation present via light touch bilateral  Musculoskeletal: Asymptomatic hammertoe pedal deformities noted bilateral. Muscular strength 5/5 in all lower extremity muscular groups bilateral without pain on range of motion. No tenderness with calf compression bilateral.  Assessment and Plan: Problem List Items Addressed This Visit      Endocrine   Diabetes mellitus due to underlying condition with unspecified complications ([place])    Other Visit Diagnoses    Ingrowing nail    -  Primary   Pain due to onychomycosis of nail          -Examined patient. -Discussed and educated patient on diabetic foot care, especially with  regards to the vascular, neurological and musculoskeletal systems.  -Stressed the importance of good glycemic control and the detriment of not  controlling glucose levels in relation to the foot. -Mechanically debrided all nails 1-5 bilateral using sterile nail nipper removing all offending borders and filed with dremel without incident  -Advised patient and wife to soak with Epson salt every day for the next week and to apply Corticosporin drops to left hallux toenail until there is no more soreness if area worsens to return to office -Answered all patient questions -Patient to return as needed or if nails  worsen -Patient advised to call the office if any problems or questions arise in the meantime.  Landis Martins, DPM

## 2019-01-04 NOTE — Patient Instructions (Signed)
Soak Instructions    THE DAY AFTER THE PROCEDURE  Place 1/4 cup of epsom salts in a quart of warm tap water.  Submerge your foot or feet and soak in the solution for 20 minutes.  This soak should be done once a day for 1 week.  Next, remove your foot or feet from solution, blot dry the affected area and cover.  You may use a band aid large enough to cover the area or use gauze and tape.  Apply other medications to the area as directed by the doctor such as polysporin neosporin.  IF YOUR SKIN BECOMES IRRITATED WHILE USING THESE INSTRUCTIONS, IT IS OKAY TO SWITCH TO  WHITE VINEGAR AND WATER. Or you may use antibacterial soap and water to keep the toe clean  Monitor for any signs/symptoms of infection. Call the office immediately if any occur or go directly to the emergency room. Call with any questions/concerns.

## 2019-01-07 ENCOUNTER — Telehealth: Payer: Self-pay | Admitting: Cardiology

## 2019-01-07 NOTE — Telephone Encounter (Signed)
°*  STAT* If patient is at the pharmacy, call can be transferred to refill team.   1. Which medications need to be refilled? (please list name of each medication and dose if known) Carvedilol 6.25mg  takes twice daily   2. Which pharmacy/location (including street and city if local pharmacy) is medication to be sent to?[facility]  3. Do they need a 30 day or 90 day supply? 180  Patient is almost out

## 2019-01-08 ENCOUNTER — Other Ambulatory Visit: Payer: Self-pay | Admitting: *Deleted

## 2019-01-08 MED ORDER — CARVEDILOL 6.25 MG PO TABS: 6.2500 mg | ORAL_TABLET | Freq: Two times a day (BID) | ORAL | 1 refills | Status: DC

## 2019-01-08 NOTE — Telephone Encounter (Signed)
Refill sent to [facility].  

## 2019-01-10 ENCOUNTER — Ambulatory Visit (INDEPENDENT_AMBULATORY_CARE_PROVIDER_SITE_OTHER): Payer: Medicare Other

## 2019-01-10 ENCOUNTER — Ambulatory Visit (INDEPENDENT_AMBULATORY_CARE_PROVIDER_SITE_OTHER): Payer: Medicare Other | Admitting: Internal Medicine

## 2019-01-10 ENCOUNTER — Other Ambulatory Visit: Payer: Self-pay

## 2019-01-10 ENCOUNTER — Encounter: Payer: Self-pay | Admitting: Internal Medicine

## 2019-01-10 DIAGNOSIS — J449 Chronic obstructive pulmonary disease, unspecified: Secondary | ICD-10-CM

## 2019-01-10 DIAGNOSIS — R05 Cough: Secondary | ICD-10-CM | POA: Diagnosis not present

## 2019-01-10 DIAGNOSIS — R058 Other specified cough: Secondary | ICD-10-CM

## 2019-01-10 DIAGNOSIS — I2510 Atherosclerotic heart disease of native coronary artery without angina pectoris: Secondary | ICD-10-CM

## 2019-01-10 DIAGNOSIS — R0602 Shortness of breath: Secondary | ICD-10-CM | POA: Diagnosis not present

## 2019-01-10 DIAGNOSIS — J9611 Chronic respiratory failure with hypoxia: Secondary | ICD-10-CM

## 2019-01-10 NOTE — Progress Notes (Signed)
Christopher Kline, male    DOB: 08/21/1930, 85 y.o.   MRN: [id number]     Brief patient profile:  [age] yowm [place] 2008 [place] [facility] [facility] and did well but around 2016 dx and copd and rx spriva and bad to worse since 05/2017 assoc with hoarseness and no better since on prednisone for PMR so referred to pulmonary clinic 11/16/2017 by Graylon GunningKate Yates with documented GOLD III criteria at initial ov    History of Present Illness  11/16/2017   Pulmonary / 1st office eval  Chief Complaint  Patient presents with  . Pulmonary Consult    Referred by Graylon GunningKate Yates, PA. Pt states he was dxed with COPD 2 years ago. Pt c/o increased DOE for the past 6 months. He gets winded walking short distances such as from lobby to exam room today.   Dyspnea:  MMRC3 = can't walk 100 yards even at a slow pace at a flat grade s stopping due to sob   Cough: minimal dry daytime Sleep: can't lie flat > feel immediately can't breath x 05/2017 "something blocking my breathing in my throat" (neg ent eval in [place])  SABA use: not helping  On pred for pmr not helping breathing. Sleeping ok only > 45 degrees  without nocturnal  or early am exacerbation  of respiratory  c/o's or need for noct saba. Also denies any obvious fluctuation of symptoms with weather or environmental changes or other aggravating or alleviating factors except as outlined above  rec Stop fish oil and lisinopril  micardis 80 mg daily will replace lisinopril Pantoprazole (protonix) 40 mg   Take  30-60 min before first meal of the day and Pepcid (famotidine)  20 mg one @  bedtime until return to office - this is the best way to tell whether stomach acid is contributing to your problem.   GERD Continue stiolto 2 pffs each am  Only use your albuterol as a rescue medication       12/18/2017 extended  f/u ov/Arnelle Nale re:  Noct "Choking sensation"   2-3 /wk since 05/2017 with progressive doe x 2016  Chief Complaint  Patient presents with  . Follow-up    PFT's  done. He states his breathing is doing well today. He is using his proair 1 x per wk on average.   Dyspnea:  Lobby to exam room - no better vs last ov Cough: late in pm p supper and 2-3 x noct per week= choking Sleeping: flat/ on side  one pillow unless then wakes up an moves to recliner 2-3 x per week and does better in this position  SABA use: maybe once a week and not sure it helps 02: none  rec Ok to use up your stiolto then try off and see what if any change this makes Increase protonix Take 30- 60 min before your first and last meals of the day  GERD diet / lifestyle recs  Stop coreg Start bisoprolol 5 mg daily  Start 02   4 lpm POC with walking, no need at rest or sleeping for now Please see patient coordinator before you leave today  to [facility]      01/10/2019  f/u ov/Shaniqua Guillot re: GOLD II spirometry but 02 dep/stiolto/on  prednisone 5 mg daily for PMR/ has used short courses macrodantin for uti Chief Complaint  Patient presents with  . Follow-up    Pt's wife states that pt has  been having problems with O2 sats dropping, having increased SOB, and also has been coughing which unsure of color of phlegm.  Dyspnea:  Extremely sedentary x 02/2018  Cough: minimal, tussin helps, better p chlorpheniraminme  Sleeping: at 45 degrees in recliner  SABA use: not using  02: 4lpm 24/7    No obvious day to day or daytime variability or assoc excess/ purulent sputum or mucus plugs or hemoptysis or cp or chest tightness, subjective wheeze or overt sinus or hb symptoms.   Sleeping as above without nocturnal  or early am exacerbation  of respiratory  c/o's or need for noct saba. Also denies any obvious fluctuation of symptoms with weather or environmental changes or other aggravating or alleviating factors except as outlined above   No unusual exposure hx or h/o childhood pna/ asthma or knowledge of premature birth.  Current Allergies, Complete Past Medical  History, Past Surgical History, Family History, and Social History were reviewed in [employer] record.  ROS  The following are not active complaints unless bolded Hoarseness, sore throat, dysphagia, dental problems, itching, sneezing,  nasal congestion or discharge of excess mucus or purulent secretions, ear ache,   fever, chills, sweats, unintended wt loss or wt gain, classically pleuritic or exertional cp,  orthopnea pnd or arm/hand swelling  or leg swelling, presyncope, palpitations, abdominal pain, anorexia, nausea, vomiting, diarrhea  or change in bowel habits or change in bladder habits, change in stools or change in urine, dysuria, hematuria,  rash, arthralgias, visual complaints, headache, numbness, weakness or ataxia or problems with walking or coordination,  change in mood or  memory.        Current Meds  Medication Sig  . aspirin EC 81 MG tablet Take 81 mg by mouth daily.  . carvedilol (COREG) 6.25 MG tablet Take 1 tablet (6.25 mg total) by mouth 2 (two) times daily.  . citalopram (CELEXA) 10 MG tablet TAKE 1 TABLET BY MOUTH AT BEDTIME FOR 7 DAYS THEN CAN INCREASE TO 2 NIGHTLY  . dorzolamide (TRUSOPT) 2 % ophthalmic solution Place 1 drop into both eyes 2 (two) times daily.  Marland Kitchen latanoprost (XALATAN) 0.005 % ophthalmic solution Place 1 drop into both eyes daily.  . Lutein 40 MG CAPS Take 1 capsule by mouth daily.  . metroNIDAZOLE (METROGEL) 1 % gel Apply topically 2 (two) times daily.  . Multiple Vitamin (MULTIVITAMIN) tablet Take 1 tablet by mouth daily.  Marland Kitchen neomycin-polymyxin-hydrocortisone (CORTISPORIN) OTIC solution Apply 2-3 drops to the ingrown toenail site twice daily. Cover with band-aid.  Marland Kitchen nitrofurantoin, macrocrystal-monohydrate, (MACROBID) 100 MG capsule   . predniSONE (DELTASONE) 5 MG tablet Take 6 mg by mouth daily with breakfast. Per Dr. Herma Carson  One 5 mg and 1 mg daily  . PROAIR HFA 108 (90 Base) MCG/ACT inhaler Inhale 2 puffs into the lungs every 4 (four)  hours as needed.   . tamsulosin (FLOMAX) 0.4 MG CAPS capsule   . telmisartan (MICARDIS) 20 MG tablet Take 1 tablet (20 mg total) by mouth daily.  . vitamin B-12 (CYANOCOBALAMIN) 1000 MCG tablet Take 1,000 mcg by mouth daily.          Objective:      amb wm nad   BP (!) 110/58 (BP Location: Right Arm, Patient Position: Sitting, Cuff Size: Normal)   Pulse 83   Ht 5\' 7"  (1.702 m)   Wt 157 lb 6.4 oz (71.4 kg)   SpO2 92% Comment: 4 pulse  BMI 24.65 kg/m  01/10/2019        157   12/18/17 173 lb (78.5 kg)  12/15/17 172 lb (78 kg)  11/16/17 168 lb (76.2 kg)       HEENT : pt wearing mask not removed for exam due to covid - 19 concerns.    NECK :  without JVD/Nodes/TM/ nl carotid upstrokes bilaterally   LUNGS: no acc muscle use,  Mild barrel  contour chest wall with bilateral  Distant bs s audible wheeze and  without cough on insp or exp maneuvers  and mild  Hyperresonant  to  percussion bilaterally     CV:  RRR  no s3 or murmur or increase in P2, and no edema   ABD:  soft and nontender with pos end  insp Hoover's  in the supine position. No bruits or organomegaly appreciated, bowel sounds nl  MS:   Nl gait/  ext warm without deformities, calf tenderness, cyanosis or clubbing No obvious joint restrictions   SKIN: warm and dry without lesions    NEURO:  alert, approp, nl sensorium with  no motor or cerebellar deficits apparent.            CXR PA and Lateral:   01/10/2019 :    I personally reviewed images and agree with radiology impression as follows:    1.  Prior CABG.  Heart size normal.  No pulmonary venous congestion.  2. COPD and bilateral pleural-parenchymal thickening again noted consistent with scarring. Similar findings noted on prior exams. No acute infiltrate noted.               Assessment

## 2019-01-10 NOTE — Patient Instructions (Addendum)
02 is 4lpm 24/7 but ok turn up with activity to maintain over 90%   Only use your albuterol as a rescue medication to be used if you can't catch your breath by resting or doing a relaxed purse lip breathing pattern.  - The less you use it, the better it will work when you need it. - Ok to use up to 2 puffs  every 4 hours if you must but call for immediate appointment if use goes up over your usual need - Don't leave home without it !!  (think of it like the spare tire for your car)   Do not recommend additional nitrofurantoin and should use a subsititute  Please remember to go to the  x-ray department  for your tests - we will call you with the results when they are available      If you are satisfied with your treatment plan,  let your doctor know and he/she can either refill your medications or you can return here when your prescription runs out.     If in any way you are not 100% satisfied,  please tell us.  If 100% better, tell your friends!  Pulmonary follow up is as needed

## 2019-01-11 NOTE — Progress Notes (Signed)
Spoke with pt's spouse, Vicente Males and notified of results per Dr. Melvyn Novas. She verbalized understanding and denied any questions.

## 2019-01-13 ENCOUNTER — Encounter: Payer: Self-pay | Admitting: Internal Medicine

## 2019-01-13 NOTE — Assessment & Plan Note (Signed)
-   ONO RA 12/02/27  02 sats < 89% x 3m - 12/18/2017   Walked RA  2 laps @ 185 ft each stopped due to  desats to 86% corrected on 4lpm POC so rec 4lpm POC with walking beyond confines of house   No change in rx needed / remains well compensated  Advised: Make sure you check your oxygen saturations at highest level of activity to be sure it stays over 90% and adjust upward to maintain this level if needed but remember to turn it back to previous settings when you stop (to conserve your supply).   >>> Pulmonary f/u can be prn if PCP willing to refill meds  - otherwise can see him yearly here for refills or to requalify for 02 as needed   I had an extended discussion with the patient reviewing all relevant studies completed to date and  lasting 15 to 20 minutes of a 25 minute visit    I performed detailed device teaching using a teach back method which extended face to face time for this visit (see above)  Each maintenance medication was reviewed in detail including emphasizing most importantly the difference between maintenance and prns and under what circumstances the prns are to be triggered using an action plan format that is not reflected in the computer generated alphabetically organized AVS which I have not found useful in most complex patients, especially with respiratory illnesses  Please see AVS for specific instructions unique to this visit that I personally wrote and verbalized to the the pt in detail and then reviewed with pt  by my nurse highlighting any  changes in therapy recommended at today's visit to their plan of care.

## 2019-01-13 NOTE — Assessment & Plan Note (Signed)
D/c ACEi 11/16/17 and rx gerd rx though still using mints 12/18/2017  - max rx gerd 12/18/2017 / reviewed diet  -  Allergy profile No visit date found. >  Eos 0.1 /  IgE  186 with RAST neg  - refer to Dr Joya Gaskins at [facility] 12/18/2017 >>>  Pt called 01/04/2018 to report problem solved and declined further f/u   F/u ent prn/ no pulmonary f/u needed

## 2019-01-13 NOTE — Assessment & Plan Note (Signed)
Quit smoking 2008 Spirometry 11/16/2017  FEV1 0.9 (42%)  Ratio 50 with classic curvature p stiolto x one puff prior  - 11/16/2017  After extensive coaching inhaler device,  effectiveness =    75% with smi > change stiolto to 2 pffs each am and try off acei/ on gerd rx   - ONO RA 12/02/27  02 sats < 89% x 64m see resp failure - PFT's  12/18/2017  FEV1 1.15 (53 % ) ratio 54   p 7 % improvement from saba p stiolto prior to study with DLCO  34 % corrects to 57  % for alv volume    - The proper method of use, as well as anticipated side effects, of a soft mist  inhaler(respimat) were discussed and demonstrated to the patient.    Pt is Group B in terms of symptom/risk and laba/lama therefore appropriate rx at this point >>>  Continue stiolto (respimat)     Advised:  formulary restrictions will be an ongoing challenge for the forseable future and I would be happy to pick an alternative if the pt will first  provide me a list of them -  pt  will need to return here for training for any new device that is required eg dpi vs hfa vs respimat.    In the meantime we can always provide samples so that the patient never runs out of any needed respiratory medications.

## 2019-01-15 DIAGNOSIS — N390 Urinary tract infection, site not specified: Secondary | ICD-10-CM | POA: Diagnosis not present

## 2019-01-25 DIAGNOSIS — R0601 Orthopnea: Secondary | ICD-10-CM | POA: Diagnosis not present

## 2019-01-25 DIAGNOSIS — I10 Essential (primary) hypertension: Secondary | ICD-10-CM | POA: Diagnosis not present

## 2019-01-25 DIAGNOSIS — K5900 Constipation, unspecified: Secondary | ICD-10-CM | POA: Diagnosis not present

## 2019-01-25 DIAGNOSIS — E7800 Pure hypercholesterolemia, unspecified: Secondary | ICD-10-CM | POA: Diagnosis not present

## 2019-01-25 DIAGNOSIS — R4182 Altered mental status, unspecified: Secondary | ICD-10-CM | POA: Diagnosis not present

## 2019-01-25 DIAGNOSIS — J302 Other seasonal allergic rhinitis: Secondary | ICD-10-CM | POA: Diagnosis not present

## 2019-01-25 DIAGNOSIS — N400 Enlarged prostate without lower urinary tract symptoms: Secondary | ICD-10-CM | POA: Diagnosis not present

## 2019-01-25 DIAGNOSIS — H409 Unspecified glaucoma: Secondary | ICD-10-CM | POA: Diagnosis not present

## 2019-01-25 DIAGNOSIS — Z9181 History of falling: Secondary | ICD-10-CM | POA: Diagnosis not present

## 2019-01-25 DIAGNOSIS — J449 Chronic obstructive pulmonary disease, unspecified: Secondary | ICD-10-CM | POA: Diagnosis not present

## 2019-01-25 DIAGNOSIS — F419 Anxiety disorder, unspecified: Secondary | ICD-10-CM | POA: Diagnosis not present

## 2019-01-28 DIAGNOSIS — J449 Chronic obstructive pulmonary disease, unspecified: Secondary | ICD-10-CM | POA: Diagnosis not present

## 2019-01-28 DIAGNOSIS — F419 Anxiety disorder, unspecified: Secondary | ICD-10-CM | POA: Diagnosis not present

## 2019-01-28 DIAGNOSIS — R0601 Orthopnea: Secondary | ICD-10-CM | POA: Diagnosis not present

## 2019-01-28 DIAGNOSIS — Z9181 History of falling: Secondary | ICD-10-CM | POA: Diagnosis not present

## 2019-01-28 DIAGNOSIS — R4182 Altered mental status, unspecified: Secondary | ICD-10-CM | POA: Diagnosis not present

## 2019-01-28 DIAGNOSIS — N400 Enlarged prostate without lower urinary tract symptoms: Secondary | ICD-10-CM | POA: Diagnosis not present

## 2019-01-29 DIAGNOSIS — N400 Enlarged prostate without lower urinary tract symptoms: Secondary | ICD-10-CM | POA: Diagnosis not present

## 2019-01-29 DIAGNOSIS — R0601 Orthopnea: Secondary | ICD-10-CM | POA: Diagnosis not present

## 2019-01-29 DIAGNOSIS — R451 Restlessness and agitation: Secondary | ICD-10-CM | POA: Diagnosis not present

## 2019-01-29 DIAGNOSIS — J449 Chronic obstructive pulmonary disease, unspecified: Secondary | ICD-10-CM | POA: Diagnosis not present

## 2019-01-29 DIAGNOSIS — R4182 Altered mental status, unspecified: Secondary | ICD-10-CM | POA: Diagnosis not present

## 2019-01-29 DIAGNOSIS — E7800 Pure hypercholesterolemia, unspecified: Secondary | ICD-10-CM | POA: Diagnosis not present

## 2019-01-29 DIAGNOSIS — N390 Urinary tract infection, site not specified: Secondary | ICD-10-CM | POA: Diagnosis not present

## 2019-01-29 DIAGNOSIS — Z9181 History of falling: Secondary | ICD-10-CM | POA: Diagnosis not present

## 2019-01-29 DIAGNOSIS — F419 Anxiety disorder, unspecified: Secondary | ICD-10-CM | POA: Diagnosis not present

## 2019-01-29 DIAGNOSIS — R531 Weakness: Secondary | ICD-10-CM | POA: Diagnosis not present

## 2019-01-29 DIAGNOSIS — E119 Type 2 diabetes mellitus without complications: Secondary | ICD-10-CM | POA: Diagnosis not present

## 2019-02-01 DIAGNOSIS — N390 Urinary tract infection, site not specified: Secondary | ICD-10-CM | POA: Diagnosis not present

## 2019-02-04 DIAGNOSIS — R4182 Altered mental status, unspecified: Secondary | ICD-10-CM | POA: Diagnosis not present

## 2019-02-04 DIAGNOSIS — R0601 Orthopnea: Secondary | ICD-10-CM | POA: Diagnosis not present

## 2019-02-04 DIAGNOSIS — N400 Enlarged prostate without lower urinary tract symptoms: Secondary | ICD-10-CM | POA: Diagnosis not present

## 2019-02-04 DIAGNOSIS — J449 Chronic obstructive pulmonary disease, unspecified: Secondary | ICD-10-CM | POA: Diagnosis not present

## 2019-02-04 DIAGNOSIS — Z9181 History of falling: Secondary | ICD-10-CM | POA: Diagnosis not present

## 2019-02-04 DIAGNOSIS — F419 Anxiety disorder, unspecified: Secondary | ICD-10-CM | POA: Diagnosis not present

## 2019-02-28 ENCOUNTER — Other Ambulatory Visit: Payer: Self-pay | Admitting: Cardiology

## 2019-03-22 ENCOUNTER — Telehealth: Payer: Self-pay | Admitting: Cardiology

## 2019-03-22 NOTE — Telephone Encounter (Signed)
Called back and pt son picked up. Informed him that it was okay for wife of pt to come and get insurance cards scanned.

## 2019-03-22 NOTE — Telephone Encounter (Signed)
Patients wife is requesting to come to the office today to get the new insurance card scanned for documentation. Please advise if it is necessary for her to come to the office.

## 2019-04-01 DIAGNOSIS — H401133 Primary open-angle glaucoma, bilateral, severe stage: Secondary | ICD-10-CM | POA: Diagnosis not present

## 2019-04-01 DIAGNOSIS — H5203 Hypermetropia, bilateral: Secondary | ICD-10-CM | POA: Diagnosis not present

## 2019-04-16 DIAGNOSIS — J449 Chronic obstructive pulmonary disease, unspecified: Secondary | ICD-10-CM | POA: Diagnosis not present

## 2019-04-17 ENCOUNTER — Other Ambulatory Visit: Payer: Self-pay | Admitting: *Deleted

## 2019-04-17 ENCOUNTER — Telehealth: Payer: Self-pay | Admitting: Cardiology

## 2019-04-17 MED ORDER — TELMISARTAN 20 MG PO TABS: 20.0000 mg | ORAL_TABLET | Freq: Every day | ORAL | 1 refills | Status: DC

## 2019-04-17 MED ORDER — CARVEDILOL 6.25 MG PO TABS: 6.2500 mg | ORAL_TABLET | Freq: Two times a day (BID) | ORAL | 1 refills | Status: DC

## 2019-04-17 MED ORDER — ATORVASTATIN CALCIUM 80 MG PO TABS: ORAL_TABLET | ORAL | 1 refills | Status: DC

## 2019-04-17 NOTE — Telephone Encounter (Signed)
Refills sent to [facility] as requested. 

## 2019-04-17 NOTE — Telephone Encounter (Signed)
New Message   *STAT* If patient is at the pharmacy, call can be transferred to refill team.   1. Which medications need to be refilled? (please list name of each medication and dose if known)  carvedilol (COREG) 6.25 MG tablet atorvastatin (LIPITOR) 80 MG tablet telmisartan (MICARDIS) 20 MG tablet  2. Which pharmacy/location (including street and city if local pharmacy) is medication to be sent to? [facility] - [place], [place] - [address]  3. Do they need a 30 day or 90 day supply? 90 day

## 2019-04-22 DIAGNOSIS — J449 Chronic obstructive pulmonary disease, unspecified: Secondary | ICD-10-CM | POA: Diagnosis not present

## 2019-04-24 ENCOUNTER — Other Ambulatory Visit: Payer: Self-pay | Admitting: Cardiology

## 2019-05-14 DIAGNOSIS — J449 Chronic obstructive pulmonary disease, unspecified: Secondary | ICD-10-CM | POA: Diagnosis not present

## 2019-05-15 ENCOUNTER — Ambulatory Visit (INDEPENDENT_AMBULATORY_CARE_PROVIDER_SITE_OTHER): Admitting: Cardiology

## 2019-05-15 ENCOUNTER — Other Ambulatory Visit: Payer: Self-pay

## 2019-05-15 ENCOUNTER — Encounter (INDEPENDENT_AMBULATORY_CARE_PROVIDER_SITE_OTHER): Payer: Self-pay

## 2019-05-15 ENCOUNTER — Encounter: Payer: Self-pay | Admitting: Cardiology

## 2019-05-15 VITALS — BP 128/60 | HR 62 | Ht 67.0 in | Wt 150.0 lb

## 2019-05-15 DIAGNOSIS — J449 Chronic obstructive pulmonary disease, unspecified: Secondary | ICD-10-CM

## 2019-05-15 DIAGNOSIS — I2510 Atherosclerotic heart disease of native coronary artery without angina pectoris: Secondary | ICD-10-CM

## 2019-05-15 DIAGNOSIS — E785 Hyperlipidemia, unspecified: Secondary | ICD-10-CM | POA: Diagnosis not present

## 2019-05-15 DIAGNOSIS — I10 Essential (primary) hypertension: Secondary | ICD-10-CM

## 2019-05-15 DIAGNOSIS — E088 Diabetes mellitus due to underlying condition with unspecified complications: Secondary | ICD-10-CM | POA: Diagnosis not present

## 2019-05-15 NOTE — Progress Notes (Signed)
Cardiology Office Note:    Date:  05/15/2019   ID:  Kline Christopher, DOB Jan 26, 1931, MRN [id number]  PCP:  Renaldo Reel, PA  Cardiologist:  Jenean Lindau, MD   Referring MD: Renaldo Reel, PA    ASSESSMENT:    1. Coronary artery disease involving native coronary artery of native heart without angina pectoris   2. Essential hypertension   3. COPD GOLD II with disproportionate reduction in DLCO   4. Diabetes mellitus due to underlying condition with unspecified complications ([place])   5. Dyslipidemia    PLAN:    In order of problems listed above:  1. Coronary artery disease: Secondary prevention stressed with the patient.  Importance of compliance with diet and medication stressed and he vocalized understanding. 2. Essential hypertension: Blood pressure is stable 3. Mixed dyslipidemia: Patient on statin therapy and diet was emphasized. 4. Importance of regular exercise stressed.  I told him to do the best possible given his multiple challenges especially with COPD and oxygen.  His wife also mentions to me that she has significant issues with dementia. 5. Patient will be seen in follow-up appointment in 6 months or earlier if the patient has any concerns 6.    Medication Adjustments/Labs and Tests Ordered: Current medicines are reviewed at length with the patient today.  Concerns regarding medicines are outlined above.  No orders of the defined types were placed in this encounter.  No orders of the defined types were placed in this encounter.    Chief Complaint  Patient presents with  . Follow-up    6 Months     History of Present Illness:    Christopher Kline is a 86 y.o. male.  Patient has past medical history of coronary artery disease.  He denies any problems at this time and leads a sedentary lifestyle.  He has COPD and uses oxygen.  He has not had blood work since November and plans to get it done by his primary care provider in the next few days.  At the time of my  evaluation, the patient is alert awake oriented and in no distress.  Past Medical History:  Diagnosis Date  . CAD (coronary artery disease)   . Chronic kidney disease   . COPD (chronic obstructive pulmonary disease) ([place])   . Glaucoma   . Hyperlipidemia   . Hypertension   . SOB (shortness of breath)     Past Surgical History:  Procedure Laterality Date  . CORONARY ARTERY BYPASS GRAFT      Current Medications: Current Meds  Medication Sig  . aspirin EC 81 MG tablet Take 81 mg by mouth daily.  Marland Kitchen atorvastatin (LIPITOR) 80 MG tablet TAKE ONE-HALF TABLET BY  MOUTH DAILY AT 6 PM.  . carvedilol (COREG) 6.25 MG tablet Take 1 tablet (6.25 mg total) by mouth 2 (two) times daily.  . citalopram (CELEXA) 10 MG tablet TAKE 1 TABLET BY MOUTH AT BEDTIME FOR 7 DAYS THEN CAN INCREASE TO 2 NIGHTLY  . dorzolamide (TRUSOPT) 2 % ophthalmic solution Place 1 drop into both eyes 2 (two) times daily.  Marland Kitchen latanoprost (XALATAN) 0.005 % ophthalmic solution Place 1 drop into both eyes daily.  . Lutein 40 MG CAPS Take 1 capsule by mouth daily.  . metroNIDAZOLE (METROGEL) 1 % gel Apply topically 2 (two) times daily.  . Multiple Vitamin (MULTIVITAMIN) tablet Take 1 tablet by mouth daily.  Marland Kitchen neomycin-polymyxin-hydrocortisone (CORTISPORIN) OTIC solution Apply 2-3 drops to the ingrown toenail site twice  daily. Cover with band-aid.  . predniSONE (DELTASONE) 5 MG tablet Take 6 mg by mouth daily with breakfast. Per Dr. Herma Carson  One 5 mg and 1 mg daily  . PROAIR HFA 108 (90 Base) MCG/ACT inhaler Inhale 2 puffs into the lungs every 4 (four) hours as needed.   . tamsulosin (FLOMAX) 0.4 MG CAPS capsule   . telmisartan (MICARDIS) 20 MG tablet TAKE 1 TABLET BY MOUTH  DAILY  . vitamin B-12 (CYANOCOBALAMIN) 1000 MCG tablet Take 1,000 mcg by mouth daily.     Allergies:   Vancomycin   Social History   Socioeconomic History  . Marital status: Married    Spouse name: Not on file  . Number of children: Not on file  . Years of  education: Not on file  . Highest education level: Not on file  Occupational History  . Not on file  Tobacco Use  . Smoking status: Former Smoker    Packs/day: 1.00    Years: 65.00    Pack years: 65.00    Types: Cigarettes, Pipe    Quit date: 03/07/2006    Years since quitting: 13.1  . Smokeless tobacco: Never Used  Substance and Sexual Activity  . Alcohol use: No  . Drug use: No  . Sexual activity: Not on file  Other Topics Concern  . Not on file  Social History Narrative  . Not on file   Social Determinants of Health   Financial Resource Strain:   . Difficulty of Paying Living Expenses: Not on file  Food Insecurity:   . Worried About Programme researcher, broadcasting/film/video in the Last Year: Not on file  . Ran Out of Food in the Last Year: Not on file  Transportation Needs:   . Lack of Transportation (Medical): Not on file  . Lack of Transportation (Non-Medical): Not on file  Physical Activity:   . Days of Exercise per Week: Not on file  . Minutes of Exercise per Session: Not on file  Stress:   . Feeling of Stress : Not on file  Social Connections:   . Frequency of Communication with Friends and Family: Not on file  . Frequency of Social Gatherings with Friends and Family: Not on file  . Attends Religious Services: Not on file  . Active Member of Clubs or Organizations: Not on file  . Attends Banker Meetings: Not on file  . Marital Status: Not on file     Family History: The patient's family history is not on file.  ROS:   Please see the history of present illness.    All other systems reviewed and are negative.  EKGs/Labs/Other Studies Reviewed:    The following studies were reviewed today: I discussed my findings with the patient at length.   Recent Labs: No results found for requested labs within last 8760 hours.  Recent Lipid Panel No results found for: CHOL, TRIG, HDL, CHOLHDL, VLDL, LDLCALC, LDLDIRECT  Physical Exam:    VS:  BP 128/60   Pulse 84   Ht  5\' 7"  (1.702 m)   Wt 150 lb (68 kg)   SpO2 90% Comment: 4 Liters of Oxygen  BMI 23.49 kg/m     Wt Readings from Last 3 Encounters:  05/15/19 150 lb (68 kg)  01/10/19 157 lb 6.4 oz (71.4 kg)  11/22/18 163 lb 6.4 oz (74.1 kg)     GEN: Patient is in no acute distress HEENT: Normal NECK: No JVD; No carotid bruits LYMPHATICS: No lymphadenopathy  CARDIAC: Hear sounds regular, 2/6 systolic murmur at the apex. RESPIRATORY:  Clear to auscultation without rales, wheezing or rhonchi  ABDOMEN: Soft, non-tender, non-distended MUSCULOSKELETAL:  No edema; No deformity  SKIN: Warm and dry NEUROLOGIC:  Alert and oriented x 3 PSYCHIATRIC:  Normal affect   Signed, Garwin Brothers, MD  05/15/2019 1:49 PM    [facility]

## 2019-05-15 NOTE — Patient Instructions (Signed)
Medication Instructions:  No mediation changes *If you need a refill on your cardiac medications before your next appointment, please call your pharmacy*   Lab Work: None ordered If you have labs (blood work) drawn today and your tests are completely normal, you will receive your results only by: Marland Kitchen MyChart Message (if you have MyChart) OR . A paper copy in the mail If you have any lab test that is abnormal or we need to change your treatment, we will call you to review the results.   Testing/Procedures: None ordered   Follow-Up: At [facility], you and your health needs are our priority.  As part of our continuing mission to provide you with exceptional heart care, we have created designated Provider Care Teams.  These Care Teams include your primary Cardiologist (physician) and Advanced Practice Providers (APPs -  Physician Assistants and Nurse Practitioners) who all work together to provide you with the care you need, when you need it.  We recommend signing up for the patient portal called "MyChart".  Sign up information is provided on this After Visit Summary.  MyChart is used to connect with patients for Virtual Visits (Telemedicine).  Patients are able to view lab/test results, encounter notes, upcoming appointments, etc.  Non-urgent messages can be sent to your provider as well.   To learn more about what you can do with MyChart, go to [email or web address].    Your next appointment:   6 month(s)  The format for your next appointment:   In Person  Provider:   Belva Crome, MD   Other Instructions NA

## 2019-05-20 DIAGNOSIS — J449 Chronic obstructive pulmonary disease, unspecified: Secondary | ICD-10-CM | POA: Diagnosis not present

## 2019-05-24 DIAGNOSIS — Z79899 Other long term (current) drug therapy: Secondary | ICD-10-CM | POA: Diagnosis not present

## 2019-05-24 DIAGNOSIS — M353 Polymyalgia rheumatica: Secondary | ICD-10-CM | POA: Diagnosis not present

## 2019-06-14 DIAGNOSIS — J449 Chronic obstructive pulmonary disease, unspecified: Secondary | ICD-10-CM | POA: Diagnosis not present

## 2019-06-20 DIAGNOSIS — J449 Chronic obstructive pulmonary disease, unspecified: Secondary | ICD-10-CM | POA: Diagnosis not present

## 2019-07-05 DIAGNOSIS — F419 Anxiety disorder, unspecified: Secondary | ICD-10-CM | POA: Diagnosis not present

## 2019-07-05 DIAGNOSIS — R531 Weakness: Secondary | ICD-10-CM | POA: Diagnosis not present

## 2019-07-05 DIAGNOSIS — R451 Restlessness and agitation: Secondary | ICD-10-CM | POA: Diagnosis not present

## 2019-07-05 DIAGNOSIS — E7800 Pure hypercholesterolemia, unspecified: Secondary | ICD-10-CM | POA: Diagnosis not present

## 2019-07-05 DIAGNOSIS — M353 Polymyalgia rheumatica: Secondary | ICD-10-CM | POA: Diagnosis not present

## 2019-07-05 DIAGNOSIS — E119 Type 2 diabetes mellitus without complications: Secondary | ICD-10-CM | POA: Diagnosis not present

## 2019-07-05 DIAGNOSIS — R630 Anorexia: Secondary | ICD-10-CM | POA: Diagnosis not present

## 2019-07-05 DIAGNOSIS — Z111 Encounter for screening for respiratory tuberculosis: Secondary | ICD-10-CM | POA: Diagnosis not present

## 2019-07-05 DIAGNOSIS — Z6823 Body mass index (BMI) 23.0-23.9, adult: Secondary | ICD-10-CM | POA: Diagnosis not present

## 2019-07-10 DIAGNOSIS — Z20822 Contact with and (suspected) exposure to covid-19: Secondary | ICD-10-CM | POA: Diagnosis not present

## 2019-07-10 DIAGNOSIS — Z20828 Contact with and (suspected) exposure to other viral communicable diseases: Secondary | ICD-10-CM | POA: Diagnosis not present

## 2019-07-14 DIAGNOSIS — J449 Chronic obstructive pulmonary disease, unspecified: Secondary | ICD-10-CM | POA: Diagnosis not present

## 2019-07-15 ENCOUNTER — Telehealth: Payer: Self-pay | Admitting: Cardiology

## 2019-07-15 NOTE — Telephone Encounter (Signed)
New message   Patient's wife states that need list of medications and how to take the medications faxed to [facility] to [facility] fax # [phone].

## 2019-07-15 NOTE — Telephone Encounter (Signed)
Spoke with Salley and notified her that the medication list was faxed to [facility].

## 2019-07-18 DIAGNOSIS — F331 Major depressive disorder, recurrent, moderate: Secondary | ICD-10-CM | POA: Diagnosis not present

## 2019-07-18 DIAGNOSIS — E559 Vitamin D deficiency, unspecified: Secondary | ICD-10-CM | POA: Diagnosis not present

## 2019-07-18 DIAGNOSIS — I2510 Atherosclerotic heart disease of native coronary artery without angina pectoris: Secondary | ICD-10-CM | POA: Diagnosis not present

## 2019-07-18 DIAGNOSIS — W19XXXA Unspecified fall, initial encounter: Secondary | ICD-10-CM | POA: Diagnosis not present

## 2019-07-18 DIAGNOSIS — I10 Essential (primary) hypertension: Secondary | ICD-10-CM | POA: Diagnosis not present

## 2019-07-18 DIAGNOSIS — R339 Retention of urine, unspecified: Secondary | ICD-10-CM | POA: Diagnosis not present

## 2019-07-18 DIAGNOSIS — R296 Repeated falls: Secondary | ICD-10-CM | POA: Diagnosis not present

## 2019-07-18 DIAGNOSIS — E785 Hyperlipidemia, unspecified: Secondary | ICD-10-CM | POA: Diagnosis not present

## 2019-07-18 DIAGNOSIS — K5900 Constipation, unspecified: Secondary | ICD-10-CM | POA: Diagnosis not present

## 2019-07-20 DIAGNOSIS — J449 Chronic obstructive pulmonary disease, unspecified: Secondary | ICD-10-CM | POA: Diagnosis not present

## 2019-08-14 DIAGNOSIS — J449 Chronic obstructive pulmonary disease, unspecified: Secondary | ICD-10-CM | POA: Diagnosis not present

## 2019-08-20 DIAGNOSIS — J449 Chronic obstructive pulmonary disease, unspecified: Secondary | ICD-10-CM | POA: Diagnosis not present

## 2019-08-27 DIAGNOSIS — L609 Nail disorder, unspecified: Secondary | ICD-10-CM | POA: Diagnosis not present

## 2019-09-13 DIAGNOSIS — J449 Chronic obstructive pulmonary disease, unspecified: Secondary | ICD-10-CM | POA: Diagnosis not present

## 2019-09-19 DIAGNOSIS — J449 Chronic obstructive pulmonary disease, unspecified: Secondary | ICD-10-CM | POA: Diagnosis not present

## 2019-09-25 DIAGNOSIS — H401133 Primary open-angle glaucoma, bilateral, severe stage: Secondary | ICD-10-CM | POA: Diagnosis not present

## 2019-09-25 DIAGNOSIS — H5203 Hypermetropia, bilateral: Secondary | ICD-10-CM | POA: Diagnosis not present

## 2019-09-26 ENCOUNTER — Other Ambulatory Visit: Payer: Self-pay | Admitting: Cardiology

## 2019-10-03 DIAGNOSIS — N390 Urinary tract infection, site not specified: Secondary | ICD-10-CM | POA: Diagnosis not present

## 2019-10-07 ENCOUNTER — Telehealth: Payer: Self-pay | Admitting: Cardiology

## 2019-10-07 MED ORDER — TELMISARTAN 20 MG PO TABS: 20.0000 mg | ORAL_TABLET | Freq: Every day | ORAL | 2 refills | Status: DC

## 2019-10-07 NOTE — Telephone Encounter (Signed)
Pt c/o medication issue:  1. Name of Medication: telmisartan (MICARDIS) 20 MG tablet  2. How are you currently taking this medication (dosage and times per day)? 1 tablet at night  3. Are you having a reaction (difficulty breathing--STAT)? no  4. What is your medication issue? Patient's wife states the patient is in an assisted living facility [facility]. She states the patient has the medication, but they need an order for the medication.

## 2019-10-07 NOTE — Telephone Encounter (Signed)
Called [facility] for fax number to fax the order. Order faxed to [facility] at [phone]. Kennon Rounds, pts wife advised that the information was sent.

## 2019-10-09 DIAGNOSIS — E7800 Pure hypercholesterolemia, unspecified: Secondary | ICD-10-CM | POA: Diagnosis not present

## 2019-10-09 DIAGNOSIS — J9611 Chronic respiratory failure with hypoxia: Secondary | ICD-10-CM | POA: Diagnosis not present

## 2019-10-09 DIAGNOSIS — E119 Type 2 diabetes mellitus without complications: Secondary | ICD-10-CM | POA: Diagnosis not present

## 2019-10-09 DIAGNOSIS — Z1331 Encounter for screening for depression: Secondary | ICD-10-CM | POA: Diagnosis not present

## 2019-10-09 DIAGNOSIS — M353 Polymyalgia rheumatica: Secondary | ICD-10-CM | POA: Diagnosis not present

## 2019-10-09 DIAGNOSIS — R451 Restlessness and agitation: Secondary | ICD-10-CM | POA: Diagnosis not present

## 2019-10-09 DIAGNOSIS — F419 Anxiety disorder, unspecified: Secondary | ICD-10-CM | POA: Diagnosis not present

## 2019-10-09 DIAGNOSIS — R531 Weakness: Secondary | ICD-10-CM | POA: Diagnosis not present

## 2019-10-09 DIAGNOSIS — R82998 Other abnormal findings in urine: Secondary | ICD-10-CM | POA: Diagnosis not present

## 2019-10-14 DIAGNOSIS — J449 Chronic obstructive pulmonary disease, unspecified: Secondary | ICD-10-CM | POA: Diagnosis not present

## 2019-10-20 DIAGNOSIS — J449 Chronic obstructive pulmonary disease, unspecified: Secondary | ICD-10-CM | POA: Diagnosis not present

## 2019-11-08 DIAGNOSIS — F0390 Unspecified dementia without behavioral disturbance: Secondary | ICD-10-CM | POA: Diagnosis not present

## 2019-11-08 DIAGNOSIS — R4182 Altered mental status, unspecified: Secondary | ICD-10-CM | POA: Diagnosis not present

## 2019-11-08 DIAGNOSIS — R000 Tachycardia, unspecified: Secondary | ICD-10-CM | POA: Diagnosis not present

## 2019-11-08 DIAGNOSIS — R29810 Facial weakness: Secondary | ICD-10-CM | POA: Diagnosis not present

## 2019-11-08 DIAGNOSIS — R918 Other nonspecific abnormal finding of lung field: Secondary | ICD-10-CM | POA: Diagnosis not present

## 2019-11-08 DIAGNOSIS — Z951 Presence of aortocoronary bypass graft: Secondary | ICD-10-CM | POA: Diagnosis not present

## 2019-11-08 DIAGNOSIS — R404 Transient alteration of awareness: Secondary | ICD-10-CM | POA: Diagnosis not present

## 2019-11-08 DIAGNOSIS — I252 Old myocardial infarction: Secondary | ICD-10-CM | POA: Diagnosis not present

## 2019-11-08 DIAGNOSIS — I700 Atherosclerosis of aorta: Secondary | ICD-10-CM | POA: Diagnosis not present

## 2019-11-08 DIAGNOSIS — R0902 Hypoxemia: Secondary | ICD-10-CM | POA: Diagnosis not present

## 2019-11-08 DIAGNOSIS — J3489 Other specified disorders of nose and nasal sinuses: Secondary | ICD-10-CM | POA: Diagnosis not present

## 2019-11-08 DIAGNOSIS — Z743 Need for continuous supervision: Secondary | ICD-10-CM | POA: Diagnosis not present

## 2019-11-08 DIAGNOSIS — I959 Hypotension, unspecified: Secondary | ICD-10-CM | POA: Diagnosis not present

## 2019-11-08 DIAGNOSIS — R279 Unspecified lack of coordination: Secondary | ICD-10-CM | POA: Diagnosis not present

## 2019-11-08 DIAGNOSIS — I7090 Unspecified atherosclerosis: Secondary | ICD-10-CM | POA: Diagnosis not present

## 2019-11-08 DIAGNOSIS — G319 Degenerative disease of nervous system, unspecified: Secondary | ICD-10-CM | POA: Diagnosis not present

## 2019-11-08 DIAGNOSIS — I2510 Atherosclerotic heart disease of native coronary artery without angina pectoris: Secondary | ICD-10-CM | POA: Diagnosis not present

## 2019-11-12 ENCOUNTER — Telehealth: Payer: Self-pay | Admitting: Cardiology

## 2019-11-12 MED ORDER — TELMISARTAN 20 MG PO TABS: 20.0000 mg | ORAL_TABLET | Freq: Every day | ORAL | 3 refills | Status: AC

## 2019-11-12 MED ORDER — TELMISARTAN 20 MG PO TABS: 20.0000 mg | ORAL_TABLET | Freq: Every day | ORAL | 3 refills | Status: DC

## 2019-11-12 MED ORDER — ATORVASTATIN CALCIUM 80 MG PO TABS: ORAL_TABLET | ORAL | 3 refills | Status: DC

## 2019-11-12 MED ORDER — ASPIRIN EC 81 MG PO TBEC: 81.0000 mg | DELAYED_RELEASE_TABLET | Freq: Every day | ORAL | 3 refills | Status: AC

## 2019-11-12 MED ORDER — CARVEDILOL 6.25 MG PO TABS: 6.2500 mg | ORAL_TABLET | Freq: Two times a day (BID) | ORAL | 3 refills | Status: AC

## 2019-11-12 NOTE — Telephone Encounter (Signed)
Kennon Rounds is calling requesting Dr. Tomie China reach out to [facility] and request the results of all the test and labs performed on 11/07/19. She states Prakash currently has Covid and is needing all his medications prescribed by Dr. Tomie China sent to [facility] for a 90 day supply. Please advise.

## 2019-11-12 NOTE — Telephone Encounter (Signed)
Refills sent in per request.    I will print all records from [facility] and give them to Dr. Tomie China to review.

## 2019-11-13 ENCOUNTER — Ambulatory Visit: Payer: Medicare HMO | Admitting: Cardiology

## 2019-11-14 DIAGNOSIS — J449 Chronic obstructive pulmonary disease, unspecified: Secondary | ICD-10-CM | POA: Diagnosis not present

## 2019-11-20 DIAGNOSIS — J449 Chronic obstructive pulmonary disease, unspecified: Secondary | ICD-10-CM | POA: Diagnosis not present

## 2019-12-14 DIAGNOSIS — J449 Chronic obstructive pulmonary disease, unspecified: Secondary | ICD-10-CM | POA: Diagnosis not present

## 2019-12-20 DIAGNOSIS — J449 Chronic obstructive pulmonary disease, unspecified: Secondary | ICD-10-CM | POA: Diagnosis not present

## 2019-12-20 IMAGING — DX DG CHEST 2V
2 series · 2 of 2 positions shown · non-contrast
Comparison: Chest x-ray 07/01/2018.  12/18/2017.

CLINICAL DATA: Shortness of breath.

EXAM:
CHEST - 2 VIEW

[chest pa]
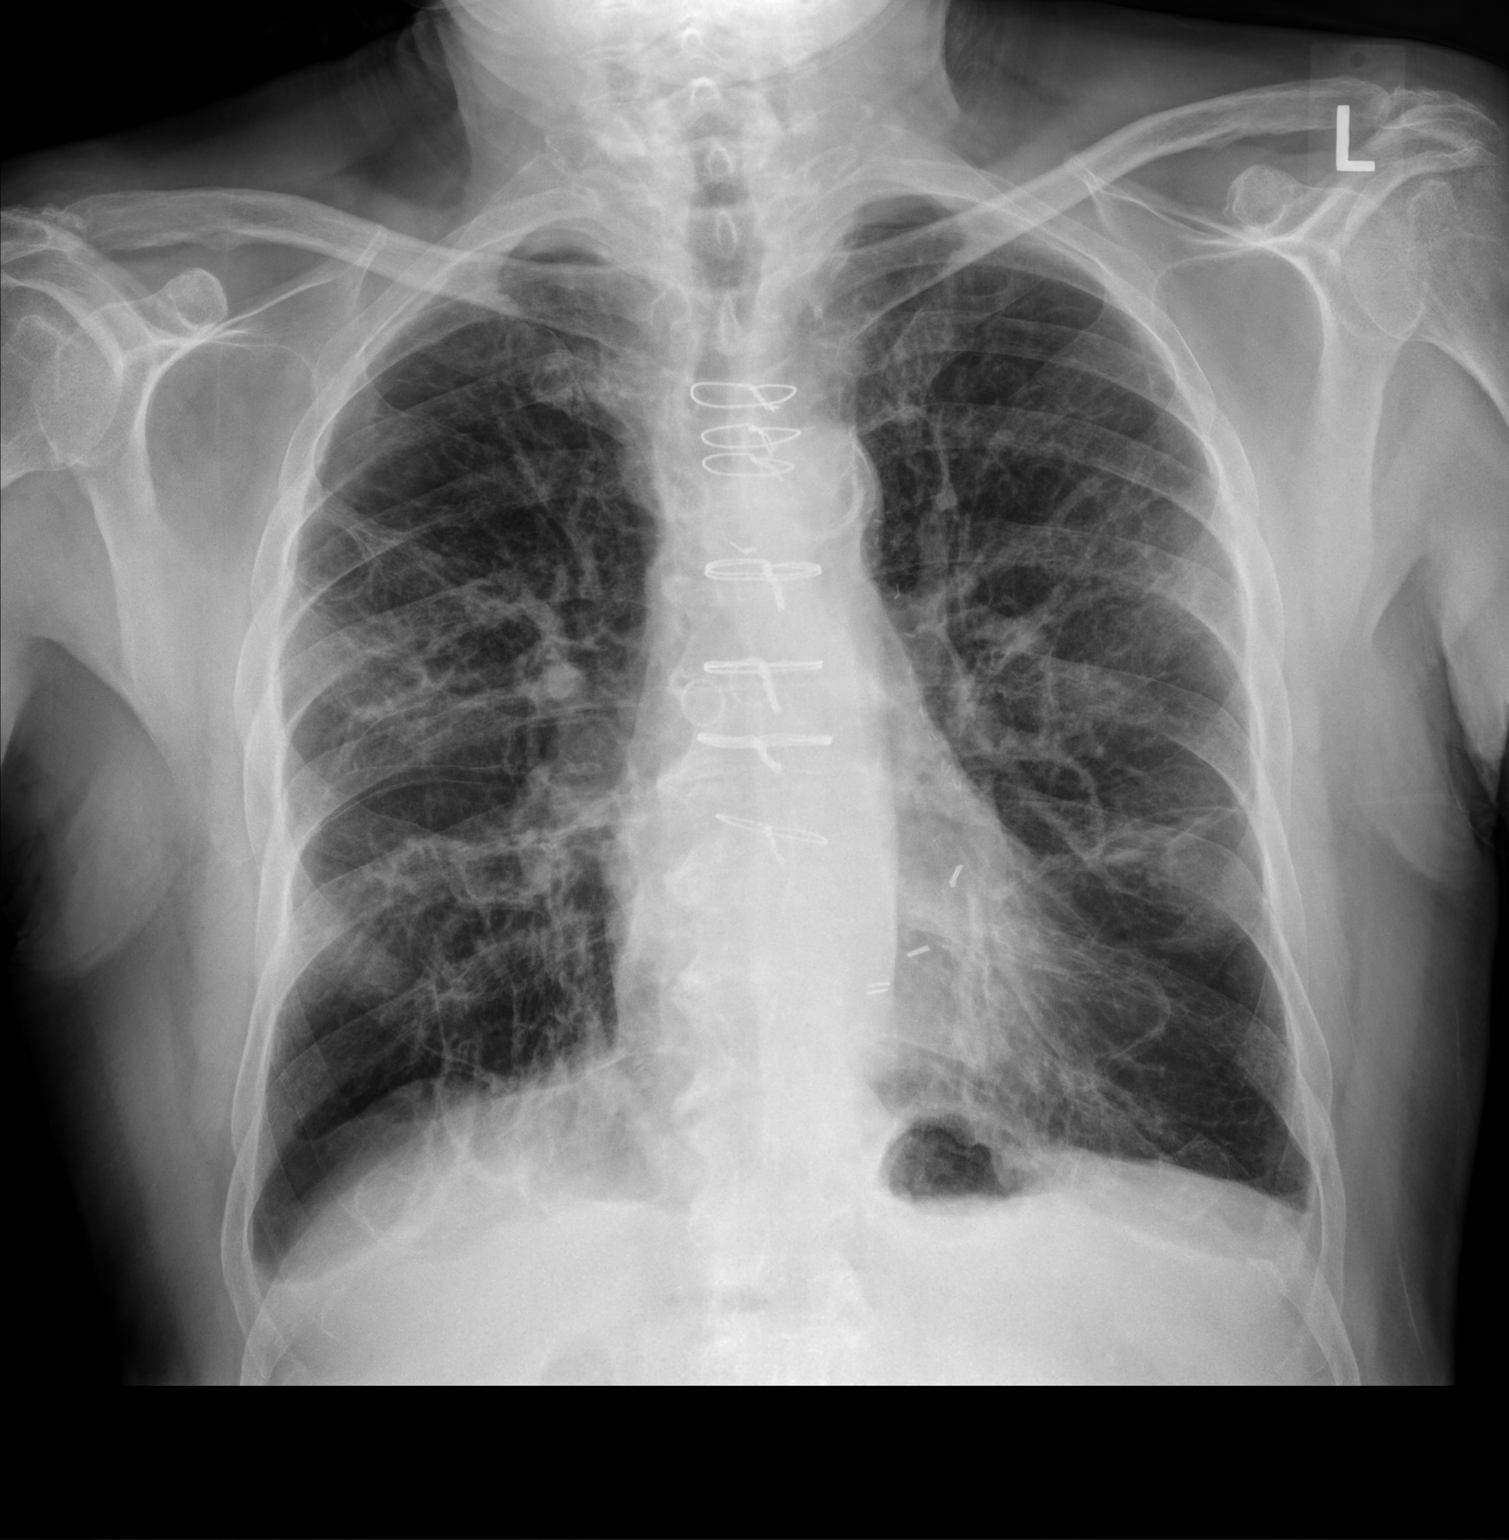

[chest lat]
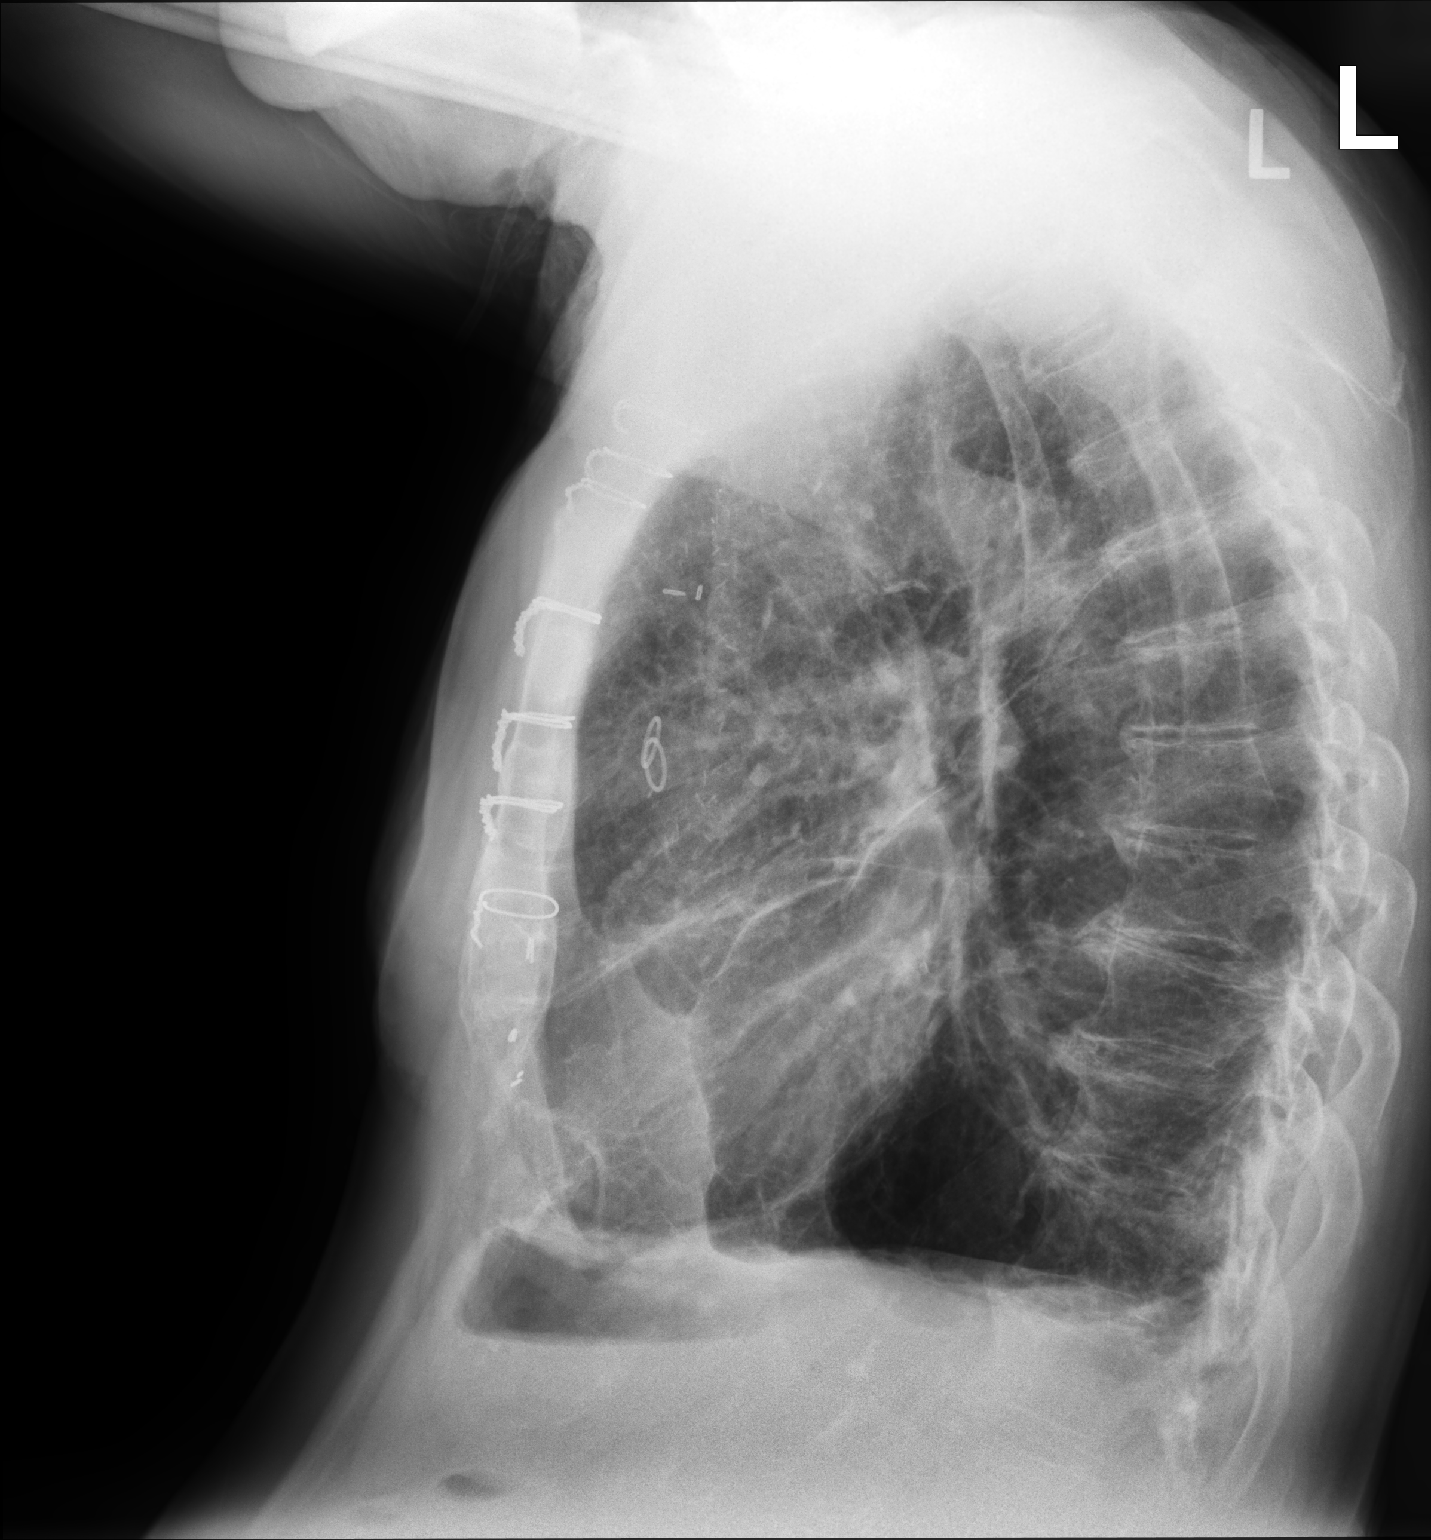

[2 of 2 positions shown; findings below may reference images not displayed]

FINDINGS: Prior CABG. Heart size normal. No pulmonary venous congestion. No
acute infiltrate.COPD. Bilateral pleural-parenchymal thickening
again noted consistent scarring. No prominent pleural effusion. No
pneumothorax.
IMPRESSION: 1.  Prior CABG.  Heart size normal.  No pulmonary venous congestion.

2. COPD and bilateral pleural-parenchymal thickening again noted
consistent with scarring. Similar findings noted on prior exams. No
acute infiltrate noted.

## 2020-01-14 DIAGNOSIS — J449 Chronic obstructive pulmonary disease, unspecified: Secondary | ICD-10-CM | POA: Diagnosis not present

## 2020-01-20 DIAGNOSIS — J449 Chronic obstructive pulmonary disease, unspecified: Secondary | ICD-10-CM | POA: Diagnosis not present

## 2020-02-04 DIAGNOSIS — Z79899 Other long term (current) drug therapy: Secondary | ICD-10-CM | POA: Diagnosis not present

## 2020-02-04 DIAGNOSIS — M353 Polymyalgia rheumatica: Secondary | ICD-10-CM | POA: Diagnosis not present

## 2020-02-04 DIAGNOSIS — M2550 Pain in unspecified joint: Secondary | ICD-10-CM | POA: Diagnosis not present

## 2020-02-05 ENCOUNTER — Other Ambulatory Visit: Payer: Self-pay

## 2020-02-05 DIAGNOSIS — I10 Essential (primary) hypertension: Secondary | ICD-10-CM | POA: Insufficient documentation

## 2020-02-05 DIAGNOSIS — N189 Chronic kidney disease, unspecified: Secondary | ICD-10-CM | POA: Insufficient documentation

## 2020-02-05 DIAGNOSIS — E785 Hyperlipidemia, unspecified: Secondary | ICD-10-CM | POA: Insufficient documentation

## 2020-02-05 DIAGNOSIS — R0602 Shortness of breath: Secondary | ICD-10-CM | POA: Insufficient documentation

## 2020-02-05 DIAGNOSIS — I2510 Atherosclerotic heart disease of native coronary artery without angina pectoris: Secondary | ICD-10-CM | POA: Insufficient documentation

## 2020-02-05 DIAGNOSIS — H409 Unspecified glaucoma: Secondary | ICD-10-CM | POA: Insufficient documentation

## 2020-02-06 ENCOUNTER — Other Ambulatory Visit: Payer: Self-pay

## 2020-02-06 ENCOUNTER — Encounter: Payer: Self-pay | Admitting: Cardiology

## 2020-02-06 ENCOUNTER — Ambulatory Visit: Payer: Medicare HMO | Admitting: Cardiology

## 2020-02-06 VITALS — BP 144/60 | HR 80 | Ht 67.0 in | Wt 142.6 lb

## 2020-02-06 DIAGNOSIS — E088 Diabetes mellitus due to underlying condition with unspecified complications: Secondary | ICD-10-CM | POA: Diagnosis not present

## 2020-02-06 DIAGNOSIS — I10 Essential (primary) hypertension: Secondary | ICD-10-CM | POA: Diagnosis not present

## 2020-02-06 DIAGNOSIS — E782 Mixed hyperlipidemia: Secondary | ICD-10-CM | POA: Diagnosis not present

## 2020-02-06 DIAGNOSIS — I2510 Atherosclerotic heart disease of native coronary artery without angina pectoris: Secondary | ICD-10-CM

## 2020-02-06 NOTE — Addendum Note (Signed)
Addended by: Eleonore Chiquito on: 02/06/2020 03:22 PM   Modules accepted: Orders

## 2020-02-06 NOTE — Patient Instructions (Signed)

## 2020-02-06 NOTE — Progress Notes (Signed)
Cardiology Office Note:    Date:  02/06/2020   ID:  BURNETTE SAUTTER, DOB 1931/01/30, MRN [id number]  PCP:  Marlyn Corporal, PA  Cardiologist:  Garwin Brothers, MD   Referring MD: Marlyn Corporal, PA    ASSESSMENT:    1. Coronary artery disease involving native coronary artery of native heart without angina pectoris   2. Essential hypertension   3. Mixed hyperlipidemia   4. Primary hypertension   5. Diabetes mellitus due to underlying condition with unspecified complications (HCC)    PLAN:    In order of problems listed above:  1. Coronary artery disease: Secondary prevention stressed with the patient.  Importance of compliance with diet medication stressed and he vocalized understanding.  I also discussed his issues with his wife.  I do not understand as to how much the patient can comprehend our discussion. 2. Essential hypertension: Blood pressure is stable and diet was emphasized. 3. Mixed dyslipidemia: Lipids were reviewed from [place] sheet and discussed with patient and his wife. 4. Because of the fact that the patient has had multiple medications I will do a Chem-7 and LFTs today diabetes mellitus: Managed by primary care physician.. 5. Redness in the left eye appears very significant and I told her to go to urgent care center to evaluate ASAP or call his eye doctor to be seen today.  She understands.   Medication Adjustments/Labs and Tests Ordered: Current medicines are reviewed at length with the patient today.  Concerns regarding medicines are outlined above.  No orders of the defined types were placed in this encounter.  No orders of the defined types were placed in this encounter.    No chief complaint on file.    History of Present Illness:    Christopher Kline is a 86 y.o. male.  Patient has past medical history of coronary artery disease, essential hypertension, dyslipidemia and diabetes mellitus.  He has significant amount of dementia.  His wife accompanies him for this  visit and brings him in a wheelchair.  He has significant amount of redness in his left eye and his wife mentions to me that she has noticed the past couple of days.  At the time of my evaluation, the patient is alert awake oriented and in no distress.  Past Medical History:  Diagnosis Date   Allergic rhinitis 05/09/2015   CAD (coronary artery disease)    Chronic kidney disease    Chronic pain syndrome 05/09/2015   Chronic respiratory failure with hypoxia (HCC) 12/18/2017   - ONO RA 12/02/27  02 sats < 89% x 68m - 12/18/2017   Walked RA  2 laps @ 185 ft each stopped due to  desats to 86% corrected on 4lpm POC so rec 4lpm POC with walking beyond confines of house    COPD (chronic obstructive pulmonary disease) (HCC)    COPD GOLD II with disproportionate reduction in DLCO 11/16/2017   Quit smoking 2008 Spirometry 11/16/2017  FEV1 0.9 (42%)  Ratio 50 with classic curvature p stiolto x one puff prior  - 11/16/2017  After extensive coaching inhaler device,  effectiveness =    75% with smi > change stiolto to 2 pffs each am and try off acei/ on gerd rx   - ONO RA 12/02/27  02 sats < 89% x 69m see resp failure - PFT's  12/18/2017  FEV1 1.15 (53 % ) ratio 54   p 7 % improvement from sab   Coronary artery disease involving  native coronary artery of native heart without angina pectoris 10/07/2014   Diabetes mellitus due to underlying condition with unspecified complications (HCC) 04/17/2017   DOE (dyspnea on exertion) 12/15/2017   Drug therapy 05/09/2015   Dryness of eye 05/09/2015   Dyslipidemia 10/07/2014   Essential hypertension 10/07/2014   Trial off acei 11/16/2017 due to hoarseness and noct sob 11/16/2017 >>> no better 12/18/2017  - try off coreg 12/18/2017       Glaucoma    Hyperlipidemia    Hypertension    OSA (obstructive sleep apnea) 12/29/2017   Home sleep study 12/14/17 ahi 14.5 with 272 minutes of desarts < 89% so rec cpap titration next 12/29/2017 >>>    Pain in joints 05/09/2015   Pleural  effusion 05/09/2015   Retinal artery occlusion 05/09/2015   SOB (shortness of breath)    Unspecified glaucoma 05/09/2015   Upper airway cough syndrome 12/18/2017   D/c ACEi 11/16/17 and rx gerd rx though still using mints 12/18/2017  - max rx gerd 12/18/2017 / reviewed diet  -  Allergy profile No visit date found. >  Eos 0.1 /  IgE  186 with RAST neg  - refer to [facility]ord [facility] at [facility] 12/18/2017 >>>  Pt called 01/04/2018 to report problem solved and declined further f/u    Vitamin D deficiency 05/09/2015    Past Surgical History:  Procedure Laterality Date   CARDIAC SURGERY     CORONARY ARTERY BYPASS GRAFT      Current Medications: Current Meds  Medication Sig   aspirin EC 81 MG tablet Take 1 tablet (81 mg total) by mouth daily.   atorvastatin (LIPITOR) 80 MG tablet TAKE 1/2 TABLET EVERY DAY AT 6PM   carvedilol (COREG) 6.25 MG tablet Take 1 tablet (6.25 mg total) by mouth 2 (two) times daily.   citalopram (CELEXA) 10 MG tablet TAKE 1 TABLET BY MOUTH AT BEDTIME FOR 7 DAYS THEN CAN INCREASE TO 2 NIGHTLY   dorzolamide (TRUSOPT) 2 % ophthalmic solution Place 1 drop into both eyes 2 (two) times daily.   latanoprost (XALATAN) 0.005 % ophthalmic solution Place 1 drop into both eyes daily.   LORazepam (ATIVAN) 0.5 MG tablet Take 0.5 mg by mouth.   Lutein 40 MG CAPS Take 1 capsule by mouth daily.   metroNIDAZOLE (METROGEL) 1 % gel Apply topically 2 (two) times daily.   Multiple Vitamin (MULTIVITAMIN) tablet Take 1 tablet by mouth daily.   neomycin-polymyxin-hydrocortisone (CORTISPORIN) OTIC solution Apply 2-3 drops to the ingrown toenail site twice daily. Cover with band-aid.   predniSONE (DELTASONE) 5 MG tablet Take 5 mg by mouth daily.   PROAIR HFA 108 (90 Base) MCG/ACT inhaler Inhale 2 puffs into the lungs every 4 (four) hours as needed.    tamsulosin (FLOMAX) 0.4 MG CAPS capsule    telmisartan (MICARDIS) 20 MG tablet Take 1 tablet (20 mg total) by mouth daily.   vitamin B-12  (CYANOCOBALAMIN) 1000 MCG tablet Take 1,000 mcg by mouth daily.     Allergies:   Vancomycin   Social History   Socioeconomic History   Marital status: Married    Spouse name: Not on file   Number of children: Not on file   Years of education: Not on file   Highest education level: Not on file  Occupational History   Not on file  Tobacco Use   Smoking status: Former Smoker    Packs/day: 1.00    Years: 65.00    Pack years: 65.00  Types: Cigarettes, Pipe    Quit date: 03/07/2006    Years since quitting: 13.9   Smokeless tobacco: Never Used  Vaping Use   Vaping Use: Never used  Substance and Sexual Activity   Alcohol use: No   Drug use: No   Sexual activity: Not on file  Other Topics Concern   Not on file  Social History Narrative   Not on file   Social Determinants of Health   Financial Resource Strain:    Difficulty of Paying Living Expenses: Not on file  Food Insecurity:    Worried About Running Out of Food in the Last Year: Not on file   [employer] of Food in the Last Year: Not on file  Transportation Needs:    Lack of Transportation (Medical): Not on file   Lack of Transportation (Non-Medical): Not on file  Physical Activity:    Days of Exercise per Week: Not on file   Minutes of Exercise per Session: Not on file  Stress:    Feeling of Stress : Not on file  Social Connections:    Frequency of Communication with Friends and Family: Not on file   Frequency of Social Gatherings with Friends and Family: Not on file   Attends Religious Services: Not on file   Active Member of Clubs or Organizations: Not on file   Attends Banker Meetings: Not on file   Marital Status: Not on file     Family History: The patient's family history includes Cancer in his father; Heart Problems in his mother; Hypertension in his father and mother.  ROS:   Please see the history of present illness.    All other systems reviewed and are  negative.  EKGs/Labs/Other Studies Reviewed:    The following studies were reviewed today: I reviewed lab work and discussed with the patient and his wife at length.   Recent Labs: No results found for requested labs within last 8760 hours.  Recent Lipid Panel No results found for: CHOL, TRIG, HDL, CHOLHDL, VLDL, LDLCALC, LDLDIRECT  Physical Exam:    VS:  BP (!) 144/60    Pulse 80    Ht 5\' 7"  (1.702 m)    Wt 142 lb 9.6 oz (64.7 kg)    BMI 22.33 kg/m     Wt Readings from Last 3 Encounters:  02/06/20 142 lb 9.6 oz (64.7 kg)  05/15/19 150 lb (68 kg)  01/10/19 157 lb 6.4 oz (71.4 kg)     GEN: Patient is in no acute distress HEENT: Normal NECK: No JVD; No carotid bruits LYMPHATICS: No lymphadenopathy CARDIAC: Hear sounds regular, 2/6 systolic murmur at the apex. RESPIRATORY:  Clear to auscultation without rales, wheezing or rhonchi  ABDOMEN: Soft, non-tender, non-distended MUSCULOSKELETAL:  No edema; No deformity  SKIN: Warm and dry NEUROLOGIC:  Alert and oriented x 3 PSYCHIATRIC:  Normal affect   Signed, 13/05/20, MD  02/06/2020 3:11 PM    [facility]

## 2020-02-07 LAB — HEPATIC FUNCTION PANEL
ALT: 26 IU/L (ref 0–44)
AST: 30 IU/L (ref 0–40)
Albumin: 3.4 g/dL — ABNORMAL LOW (ref 3.6–4.6)
Alkaline Phosphatase: 61 IU/L (ref 44–121)
Bilirubin Total: 0.3 mg/dL (ref 0.0–1.2)
Bilirubin, Direct: 0.12 mg/dL (ref 0.00–0.40)
Total Protein: 5.9 g/dL — ABNORMAL LOW (ref 6.0–8.5)

## 2020-02-07 LAB — BASIC METABOLIC PANEL
BUN/Creatinine Ratio: 17 (ref 10–24)
BUN: 16 mg/dL (ref 8–27)
CO2: 29 mmol/L (ref 20–29)
Calcium: 8.7 mg/dL (ref 8.6–10.2)
Chloride: 103 mmol/L (ref 96–106)
Creatinine, Ser: 0.92 mg/dL (ref 0.76–1.27)
GFR calc Af Amer: 86 mL/min/{1.73_m2} (ref >59)
GFR calc non Af Amer: 75 mL/min/{1.73_m2} (ref >59)
Glucose: 237 mg/dL — ABNORMAL HIGH (ref 65–99)
Potassium: 4.7 mmol/L (ref 3.5–5.2)
Sodium: 142 mmol/L (ref 134–144)

## 2020-02-13 DIAGNOSIS — J449 Chronic obstructive pulmonary disease, unspecified: Secondary | ICD-10-CM | POA: Diagnosis not present

## 2020-02-18 DIAGNOSIS — Z6821 Body mass index (BMI) 21.0-21.9, adult: Secondary | ICD-10-CM | POA: Diagnosis not present

## 2020-02-18 DIAGNOSIS — F419 Anxiety disorder, unspecified: Secondary | ICD-10-CM | POA: Diagnosis not present

## 2020-02-18 DIAGNOSIS — E7800 Pure hypercholesterolemia, unspecified: Secondary | ICD-10-CM | POA: Diagnosis not present

## 2020-02-18 DIAGNOSIS — J9611 Chronic respiratory failure with hypoxia: Secondary | ICD-10-CM | POA: Diagnosis not present

## 2020-02-18 DIAGNOSIS — Z1331 Encounter for screening for depression: Secondary | ICD-10-CM | POA: Diagnosis not present

## 2020-02-18 DIAGNOSIS — E119 Type 2 diabetes mellitus without complications: Secondary | ICD-10-CM | POA: Diagnosis not present

## 2020-02-18 DIAGNOSIS — M353 Polymyalgia rheumatica: Secondary | ICD-10-CM | POA: Diagnosis not present

## 2020-02-19 DIAGNOSIS — J449 Chronic obstructive pulmonary disease, unspecified: Secondary | ICD-10-CM | POA: Diagnosis not present

## 2020-03-04 ENCOUNTER — Telehealth: Payer: Self-pay | Admitting: Cardiology

## 2020-03-04 MED ORDER — ATORVASTATIN CALCIUM 40 MG PO TABS: 40.0000 mg | ORAL_TABLET | Freq: Every day | ORAL | 0 refills | Status: DC

## 2020-03-04 MED ORDER — ATORVASTATIN CALCIUM 80 MG PO TABS: ORAL_TABLET | ORAL | 0 refills | Status: DC

## 2020-03-04 MED ORDER — ATORVASTATIN CALCIUM 40 MG PO TABS
40.0000 mg | ORAL_TABLET | Freq: Every day | ORAL | 3 refills | Status: DC
Start: 2020-03-04 — End: 2020-06-09

## 2020-03-04 NOTE — Telephone Encounter (Signed)
Call placed to Pt's wife.  Clarified that Pt is taking atorvastatin 40 mg PO daily.  Sent 10 pills to [facility] as requested.  Sent new refill to online pharmacy.  Pt's wife thanked nurse for calling to clarify prescription.

## 2020-03-04 NOTE — Telephone Encounter (Signed)
*  STAT* If patient is at the pharmacy, call can be transferred to refill team.   1. Which medications need to be refilled? (please list name of each medication and dose if known)  Need a prescription to local pharmacy for Atorvastatin 40 mg until mail order comes  2. Which pharmacy/location (including street and city if local pharmacy) is medication to be sent to? [facility] [address]  3. Do they need a 30 day or 90 day supply?  10 days which is #20

## 2020-03-10 DIAGNOSIS — H401133 Primary open-angle glaucoma, bilateral, severe stage: Secondary | ICD-10-CM | POA: Diagnosis not present

## 2020-03-10 DIAGNOSIS — H47291 Other optic atrophy, right eye: Secondary | ICD-10-CM | POA: Diagnosis not present

## 2020-03-10 DIAGNOSIS — H2512 Age-related nuclear cataract, left eye: Secondary | ICD-10-CM | POA: Diagnosis not present

## 2020-03-15 DIAGNOSIS — J449 Chronic obstructive pulmonary disease, unspecified: Secondary | ICD-10-CM | POA: Diagnosis not present

## 2020-03-21 DIAGNOSIS — J449 Chronic obstructive pulmonary disease, unspecified: Secondary | ICD-10-CM | POA: Diagnosis not present

## 2020-04-15 DIAGNOSIS — J449 Chronic obstructive pulmonary disease, unspecified: Secondary | ICD-10-CM | POA: Diagnosis not present

## 2020-04-21 DIAGNOSIS — J449 Chronic obstructive pulmonary disease, unspecified: Secondary | ICD-10-CM | POA: Diagnosis not present

## 2020-05-21 ENCOUNTER — Telehealth: Payer: Self-pay | Admitting: Cardiology

## 2020-05-21 NOTE — Telephone Encounter (Signed)
Patient's wife calling to request the call for consent for the patient's virtual appointment be done the day before on her home number [phone]. She states to call between 10:30am and 11am. She states for the appointment to call her cell phone [phone].

## 2020-06-09 ENCOUNTER — Other Ambulatory Visit: Payer: Self-pay

## 2020-06-10 ENCOUNTER — Encounter: Payer: Self-pay | Admitting: Cardiology

## 2020-06-10 ENCOUNTER — Telehealth (INDEPENDENT_AMBULATORY_CARE_PROVIDER_SITE_OTHER): Admitting: Cardiology

## 2020-06-10 VITALS — BP 128/64 | HR 90 | Wt 140.0 lb

## 2020-06-10 DIAGNOSIS — I2510 Atherosclerotic heart disease of native coronary artery without angina pectoris: Secondary | ICD-10-CM | POA: Diagnosis not present

## 2020-06-10 DIAGNOSIS — E782 Mixed hyperlipidemia: Secondary | ICD-10-CM

## 2020-06-10 DIAGNOSIS — I10 Essential (primary) hypertension: Secondary | ICD-10-CM | POA: Diagnosis not present

## 2020-06-10 DIAGNOSIS — R0600 Dyspnea, unspecified: Secondary | ICD-10-CM

## 2020-06-10 DIAGNOSIS — R0609 Other forms of dyspnea: Secondary | ICD-10-CM

## 2020-06-10 NOTE — Progress Notes (Signed)
Virtual Visit via Telephone Note   This visit type was conducted due to national recommendations for restrictions regarding the COVID-19 Pandemic (e.g. social distancing) in an effort to limit this patient's exposure and mitigate transmission in our community.  Due to his co-morbid illnesses, this patient is at least at moderate risk for complications without adequate follow up.  This format is felt to be most appropriate for this patient at this time.  The patient did not have access to video technology/had technical difficulties with video requiring transitioning to audio format only (telephone).  All issues noted in this document were discussed and addressed.  No physical exam could be performed with this format.  Please refer to the patient's chart for his  consent to telehealth for [facility]    Date:  06/10/2020   ID:  Christopher Kline, DOB 12/29/1930, MRN [id number] The patient was identified using 2 identifiers.  Patient Location: Skilled Nursing Facility Provider Location: Office/Clinic   PCP:  Marlyn Corporal, PA   [facility]  Cardiologist:  No primary care provider on file.  Advanced Practice Provider:  No care team member to display Electrophysiologist:  None   Click here to update then REFRESH NOTE - MD (PCP) or APP (Team Member)  Change PCP Type for MD, Specialty for APP is either Cardiology or Clinical Cardiac Electrophysiology  :[id number]}   Evaluation Performed:  Follow-Up Visit  Chief Complaint: Coronary artery disease follow-up  History of Present Illness:    Christopher Kline is a 87 y.o. male with history of coronary artery disease essential hypertension and dyslipidemia and diabetes mellitus.  Patient is in a nursing home and the history is mostly provided by the wife.  She is beside the patient.  She asked him the questions that I checked with her and would reply to me.  Patient is asymptomatic denies any chest pain orthopnea or PND.  He is  basically sedentary.  At the time of my evaluation, the patient is alert awake oriented and in no distress.  The patient does not have symptoms concerning for COVID-19 infection (fever, chills, cough, or new shortness of breath).    Past Medical History:  Diagnosis Date  . Allergic rhinitis 05/09/2015  . CAD (coronary artery disease)   . Chronic kidney disease   . Chronic pain syndrome 05/09/2015  . Chronic respiratory failure with hypoxia (HCC) 12/18/2017   - ONO RA 12/02/27  02 sats < 89% x 54m - 12/18/2017   Walked RA  2 laps @ 185 ft each stopped due to  desats to 86% corrected on 4lpm POC so rec 4lpm POC with walking beyond confines of house   . COPD (chronic obstructive pulmonary disease) (HCC)   . COPD GOLD II with disproportionate reduction in DLCO 11/16/2017   Quit smoking 2008 Spirometry 11/16/2017  FEV1 0.9 (42%)  Ratio 50 with classic curvature p stiolto x one puff prior  - 11/16/2017  After extensive coaching inhaler device,  effectiveness =    75% with smi > change stiolto to 2 pffs each am and try off acei/ on gerd rx   - ONO RA 12/02/27  02 sats < 89% x 34m see resp failure - PFT's  12/18/2017  FEV1 1.15 (53 % ) ratio 54   p 7 % improvement from sab  . Coronary artery disease involving native coronary artery of native heart without angina pectoris 10/07/2014  . Diabetes mellitus due to underlying condition with unspecified  complications (HCC) 04/17/2017  . DOE (dyspnea on exertion) 12/15/2017  . Drug therapy 05/09/2015  . Dryness of eye 05/09/2015  . Dyslipidemia 10/07/2014  . Essential hypertension 10/07/2014   Trial off acei 11/16/2017 due to hoarseness and noct sob 11/16/2017 >>> no better 12/18/2017  - try off coreg 12/18/2017      . Glaucoma   . Hyperlipidemia   . Hypertension   . OSA (obstructive sleep apnea) 12/29/2017   Home sleep study 12/14/17 ahi 14.5 with 272 minutes of desarts < 89% so rec cpap titration next 12/29/2017 >>>   . Pain in joints 05/09/2015  . Pleural effusion 05/09/2015   . Retinal artery occlusion 05/09/2015  . SOB (shortness of breath)   . Unspecified glaucoma 05/09/2015  . Upper airway cough syndrome 12/18/2017   D/c ACEi 11/16/17 and rx gerd rx though still using mints 12/18/2017  - max rx gerd 12/18/2017 / reviewed diet  -  Allergy profile No visit date found. >  Eos 0.1 /  IgE  186 with RAST neg  - refer to [facility]elford Field at [facility] 12/18/2017 >>>  Pt called 01/04/2018 to report problem solved and declined further f/u   . Vitamin D deficiency 05/09/2015   Past Surgical History:  Procedure Laterality Date  . CARDIAC SURGERY    . CORONARY ARTERY BYPASS GRAFT       Current Meds  Medication Sig  . aspirin EC 81 MG tablet Take 1 tablet (81 mg total) by mouth daily.  Marland Kitchen atorvastatin (LIPITOR) 40 MG tablet Take 40 mg by mouth daily.  . carvedilol (COREG) 6.25 MG tablet Take 1 tablet (6.25 mg total) by mouth 2 (two) times daily.  . citalopram (CELEXA) 20 MG tablet Take 20 mg by mouth daily.  . dorzolamide (TRUSOPT) 2 % ophthalmic solution Place 1 drop into both eyes 2 (two) times daily.  Marland Kitchen latanoprost (XALATAN) 0.005 % ophthalmic solution Place 1 drop into both eyes daily.  Marland Kitchen LORazepam (ATIVAN) 0.5 MG tablet Take 0.5 mg by mouth as needed for anxiety.  . Multiple Vitamin (MULTIVITAMIN) tablet Take 1 tablet by mouth daily.  . nitroGLYCERIN (NITROSTAT) 0.4 MG SL tablet Place 0.4 mg under the tongue every 5 (five) minutes as needed for chest pain.  . predniSONE (DELTASONE) 5 MG tablet Take 5 mg by mouth daily.  . tamsulosin (FLOMAX) 0.4 MG CAPS capsule Take 0.4 mg by mouth daily.  Marland Kitchen telmisartan (MICARDIS) 20 MG tablet Take 1 tablet (20 mg total) by mouth daily.  . [DISCONTINUED] albuterol (VENTOLIN HFA) 108 (90 Base) MCG/ACT inhaler Inhale 2 puffs into the lungs every 4 (four) hours as needed for wheezing or shortness of breath.     Allergies:   Vancomycin   Social History   Tobacco Use  . Smoking status: Former Smoker    Packs/day: 1.00    Years: 65.00    Pack  years: 65.00    Types: Cigarettes, Pipe    Quit date: 03/07/2006    Years since quitting: 14.2  . Smokeless tobacco: Never Used  Vaping Use  . Vaping Use: Never used  Substance Use Topics  . Alcohol use: No  . Drug use: No     Family Hx: The patient's family history includes Cancer in his father; Heart Problems in his mother; Hypertension in his father and mother.  ROS:   Please see the history of present illness.    I discussed my findings with the patient at length. All other systems reviewed and are  negative.   Prior CV studies:   The following studies were reviewed today:  Prior echocardiogram and blood work was reviewed.  Labs/Other Tests and Data Reviewed:    EKG:  No ECG reviewed.  Recent Labs: 02/06/2020: ALT 26; BUN 16; Creatinine, Ser 0.92; Potassium 4.7; Sodium 142   Recent Lipid Panel No results found for: CHOL, TRIG, HDL, CHOLHDL, LDLCALC, LDLDIRECT  Wt Readings from Last 3 Encounters:  06/10/20 140 lb (63.5 kg)  02/06/20 142 lb 9.6 oz (64.7 kg)  05/15/19 150 lb (68 kg)     Risk Assessment/Calculations:      Objective:    Vital Signs:  BP 128/64   Pulse 90   Wt 140 lb (63.5 kg)   BMI 21.93 kg/m    VITAL SIGNS:  reviewed  ASSESSMENT & PLAN:    1. Coronary artery disease: Secondary prevention stressed.  Importance of compliance with diet medication stressed and the vocalized understanding.  I told him to get blood work done when they see primary care physician and send me a copy of the complete panel and she is agreeable. 2. Essential hypertension: Blood pressure stable and diet was emphasized. 3. Mixed dyslipidemia: On statin therapy and tolerating well. 4. Patient will be seen in follow-up appointment in 6 months or earlier if the patient has any concerns         COVID-19 Education: The signs and symptoms of COVID-19 were discussed with the patient and how to seek care for testing (follow up with PCP or arrange E-visit).  The importance of  social distancing was discussed today.  Time:   Today, I have spent 15 minutes with the patient with telehealth technology discussing the above problems.     Medication Adjustments/Labs and Tests Ordered: Current medicines are reviewed at length with the patient today.  Concerns regarding medicines are outlined above.   Tests Ordered: No orders of the defined types were placed in this encounter.   Medication Changes: No orders of the defined types were placed in this encounter.   Follow Up:  Virtual Visit  74mo  Signed, Garwin Brothers, MD  06/10/2020 2:00 PM    [facility]

## 2020-06-24 DIAGNOSIS — R300 Dysuria: Secondary | ICD-10-CM | POA: Diagnosis not present

## 2020-07-09 DIAGNOSIS — I10 Essential (primary) hypertension: Secondary | ICD-10-CM | POA: Diagnosis not present

## 2020-07-09 DIAGNOSIS — I2510 Atherosclerotic heart disease of native coronary artery without angina pectoris: Secondary | ICD-10-CM | POA: Diagnosis not present

## 2020-07-09 DIAGNOSIS — J449 Chronic obstructive pulmonary disease, unspecified: Secondary | ICD-10-CM | POA: Diagnosis not present

## 2020-07-09 DIAGNOSIS — F419 Anxiety disorder, unspecified: Secondary | ICD-10-CM | POA: Diagnosis not present

## 2020-08-04 DIAGNOSIS — M353 Polymyalgia rheumatica: Secondary | ICD-10-CM | POA: Diagnosis not present

## 2020-08-04 DIAGNOSIS — Z79899 Other long term (current) drug therapy: Secondary | ICD-10-CM | POA: Diagnosis not present

## 2020-08-13 DIAGNOSIS — E119 Type 2 diabetes mellitus without complications: Secondary | ICD-10-CM | POA: Diagnosis not present

## 2020-08-13 DIAGNOSIS — F419 Anxiety disorder, unspecified: Secondary | ICD-10-CM | POA: Diagnosis not present

## 2020-08-13 DIAGNOSIS — Z6821 Body mass index (BMI) 21.0-21.9, adult: Secondary | ICD-10-CM | POA: Diagnosis not present

## 2020-08-13 DIAGNOSIS — E876 Hypokalemia: Secondary | ICD-10-CM | POA: Diagnosis not present

## 2020-08-13 DIAGNOSIS — M353 Polymyalgia rheumatica: Secondary | ICD-10-CM | POA: Diagnosis not present

## 2020-08-13 DIAGNOSIS — E7800 Pure hypercholesterolemia, unspecified: Secondary | ICD-10-CM | POA: Diagnosis not present

## 2020-08-13 DIAGNOSIS — J9611 Chronic respiratory failure with hypoxia: Secondary | ICD-10-CM | POA: Diagnosis not present

## 2020-11-05 DEATH — deceased
